# Patient Record
Sex: Female | Born: 1960
Health system: Southern US, Community
[De-identification: ages and names within clinical notes are randomized; demographics above are authoritative.]

## PROBLEM LIST (undated history)

## (undated) DIAGNOSIS — K6389 Other specified diseases of intestine: Secondary | ICD-10-CM

## (undated) DIAGNOSIS — J302 Other seasonal allergic rhinitis: Secondary | ICD-10-CM

## (undated) DIAGNOSIS — M7732 Calcaneal spur, left foot: Secondary | ICD-10-CM

## (undated) HISTORY — DX: Other specified diseases of intestine: K63.89

---

## 2002-08-29 ENCOUNTER — Emergency Department (HOSPITAL_COMMUNITY): Admission: EM | Admit: 2002-08-29 | Discharge: 2002-08-29 | Payer: Self-pay | Admitting: Emergency Medicine

## 2005-08-19 ENCOUNTER — Ambulatory Visit (HOSPITAL_COMMUNITY): Admission: RE | Admit: 2005-08-19 | Discharge: 2005-08-19 | Payer: Self-pay | Admitting: Family Medicine

## 2005-09-18 ENCOUNTER — Ambulatory Visit (HOSPITAL_COMMUNITY): Admission: RE | Admit: 2005-09-18 | Discharge: 2005-09-18 | Payer: Self-pay | Admitting: Family Medicine

## 2006-06-18 ENCOUNTER — Ambulatory Visit (HOSPITAL_COMMUNITY): Admission: RE | Admit: 2006-06-18 | Discharge: 2006-06-18 | Payer: Self-pay | Admitting: Family Medicine

## 2006-11-05 ENCOUNTER — Ambulatory Visit (HOSPITAL_COMMUNITY): Admission: RE | Admit: 2006-11-05 | Discharge: 2006-11-05 | Payer: Self-pay | Admitting: Family Medicine

## 2007-06-24 ENCOUNTER — Ambulatory Visit (HOSPITAL_COMMUNITY): Admission: RE | Admit: 2007-06-24 | Discharge: 2007-06-24 | Payer: Self-pay | Admitting: Family Medicine

## 2008-07-01 ENCOUNTER — Ambulatory Visit (HOSPITAL_COMMUNITY): Admission: RE | Admit: 2008-07-01 | Discharge: 2008-07-01 | Payer: Self-pay | Admitting: Family Medicine

## 2009-09-15 ENCOUNTER — Ambulatory Visit (HOSPITAL_COMMUNITY): Admission: RE | Admit: 2009-09-15 | Discharge: 2009-09-15 | Payer: Self-pay | Admitting: Family Medicine

## 2010-09-17 ENCOUNTER — Ambulatory Visit (HOSPITAL_COMMUNITY)
Admission: RE | Admit: 2010-09-17 | Discharge: 2010-09-17 | Payer: Self-pay | Source: Home / Self Care | Attending: Family Medicine | Admitting: Family Medicine

## 2011-08-20 ENCOUNTER — Other Ambulatory Visit: Payer: Self-pay | Admitting: Family Medicine

## 2011-08-20 DIAGNOSIS — Z139 Encounter for screening, unspecified: Secondary | ICD-10-CM

## 2011-09-20 ENCOUNTER — Ambulatory Visit (HOSPITAL_COMMUNITY)
Admission: RE | Admit: 2011-09-20 | Discharge: 2011-09-20 | Disposition: A | Discharge status: Home / Self Care | Payer: BC Managed Care – PPO | Source: Ambulatory Visit | Attending: Family Medicine | Admitting: Family Medicine

## 2011-09-20 DIAGNOSIS — Z139 Encounter for screening, unspecified: Secondary | ICD-10-CM

## 2011-09-20 DIAGNOSIS — Z1231 Encounter for screening mammogram for malignant neoplasm of breast: Secondary | ICD-10-CM | POA: Insufficient documentation

## 2012-09-09 ENCOUNTER — Other Ambulatory Visit: Payer: Self-pay | Admitting: Family Medicine

## 2012-09-09 DIAGNOSIS — Z09 Encounter for follow-up examination after completed treatment for conditions other than malignant neoplasm: Secondary | ICD-10-CM

## 2012-09-22 ENCOUNTER — Ambulatory Visit (HOSPITAL_COMMUNITY): Payer: BC Managed Care – PPO

## 2012-09-28 ENCOUNTER — Ambulatory Visit (HOSPITAL_COMMUNITY)
Admission: RE | Admit: 2012-09-28 | Discharge: 2012-09-28 | Disposition: A | Discharge status: Home / Self Care | Payer: BC Managed Care – PPO | Source: Ambulatory Visit | Attending: Family Medicine | Admitting: Family Medicine

## 2012-09-28 ENCOUNTER — Ambulatory Visit (HOSPITAL_COMMUNITY): Payer: BC Managed Care – PPO

## 2012-09-28 DIAGNOSIS — Z1231 Encounter for screening mammogram for malignant neoplasm of breast: Secondary | ICD-10-CM | POA: Insufficient documentation

## 2012-09-28 DIAGNOSIS — Z09 Encounter for follow-up examination after completed treatment for conditions other than malignant neoplasm: Secondary | ICD-10-CM

## 2013-07-05 ENCOUNTER — Ambulatory Visit (INDEPENDENT_AMBULATORY_CARE_PROVIDER_SITE_OTHER): Payer: BC Managed Care – PPO | Admitting: Family Medicine

## 2013-07-05 ENCOUNTER — Encounter: Payer: Self-pay | Admitting: Family Medicine

## 2013-07-05 VITALS — Temp 98.0°F | Ht 64.0 in | Wt 163.2 lb

## 2013-07-05 DIAGNOSIS — L259 Unspecified contact dermatitis, unspecified cause: Secondary | ICD-10-CM

## 2013-07-05 DIAGNOSIS — L309 Dermatitis, unspecified: Secondary | ICD-10-CM

## 2013-07-05 MED ORDER — PREDNISONE 20 MG PO TABS: ORAL_TABLET | ORAL | Status: DC

## 2013-07-05 MED ORDER — TRIAMCINOLONE ACETONIDE 0.1 % EX CREA: TOPICAL_CREAM | Freq: Two times a day (BID) | CUTANEOUS | Status: DC

## 2013-07-05 NOTE — Progress Notes (Signed)
  Subjective:    Patient ID: Jennifer Palmer, female    DOB: 08-04-1961, 52 y.o.   MRN: [id number]  Rash This is a new problem. The current episode started in the past 7 days. The problem is unchanged. The affected locations include the left arm and right arm. The rash is characterized by redness and itchiness. She was exposed to nothing. Past treatments include nothing. The treatment provided no relief.   Patient relates she typically wear short sleeves at work she denies any other exposure. Not been working out the aren't new soaps no new soaps no detergents or other problems like that PMH benign family history benign social doesn't smoke  Review of Systems  Skin: Positive for rash.   no fevers chest pain shortness of breath weight loss    Objective:   Physical Exam  Lungs clear heart regular rate used to erythematous patches on both arms has the appearance of hives but does not itch to the same degree that hives do      Assessment & Plan:  Probable allergic dermatitis-prednisone taper along with Kenalog cream when necessary if not improved over the next week let us know we'll set up with dermatology.

## 2013-07-05 NOTE — Patient Instructions (Signed)
Use the cream twice a day  Use the prednisone taper, if this is not improving over the next 7 days please call me and I will set you up with a dermatologist  It is not contagious/ not shingles  This is a allergy issue.

## 2013-10-04 ENCOUNTER — Other Ambulatory Visit: Payer: Self-pay | Admitting: Family Medicine

## 2013-10-04 DIAGNOSIS — Z139 Encounter for screening, unspecified: Secondary | ICD-10-CM

## 2013-10-05 ENCOUNTER — Ambulatory Visit (HOSPITAL_COMMUNITY)
Admission: RE | Admit: 2013-10-05 | Discharge: 2013-10-05 | Disposition: A | Discharge status: Home / Self Care | Payer: BC Managed Care – PPO | Source: Ambulatory Visit | Attending: Family Medicine | Admitting: Family Medicine

## 2013-10-05 DIAGNOSIS — Z139 Encounter for screening, unspecified: Secondary | ICD-10-CM

## 2013-10-05 DIAGNOSIS — Z1231 Encounter for screening mammogram for malignant neoplasm of breast: Secondary | ICD-10-CM | POA: Insufficient documentation

## 2014-10-05 ENCOUNTER — Other Ambulatory Visit: Payer: Self-pay | Admitting: Family Medicine

## 2014-10-05 DIAGNOSIS — Z1231 Encounter for screening mammogram for malignant neoplasm of breast: Secondary | ICD-10-CM

## 2014-10-17 ENCOUNTER — Ambulatory Visit (HOSPITAL_COMMUNITY)
Admission: RE | Admit: 2014-10-17 | Discharge: 2014-10-17 | Disposition: A | Discharge status: Home / Self Care | Payer: BLUE CROSS/BLUE SHIELD | Source: Ambulatory Visit | Attending: Family Medicine | Admitting: Family Medicine

## 2014-10-17 DIAGNOSIS — Z1231 Encounter for screening mammogram for malignant neoplasm of breast: Secondary | ICD-10-CM | POA: Insufficient documentation

## 2015-11-17 ENCOUNTER — Encounter: Payer: Self-pay | Admitting: Family Medicine

## 2015-11-17 ENCOUNTER — Ambulatory Visit (INDEPENDENT_AMBULATORY_CARE_PROVIDER_SITE_OTHER): Payer: BLUE CROSS/BLUE SHIELD | Admitting: Family Medicine

## 2015-11-17 VITALS — BP 130/70 | Temp 99.0°F | Ht 64.0 in | Wt 215.1 lb

## 2015-11-17 DIAGNOSIS — J019 Acute sinusitis, unspecified: Secondary | ICD-10-CM

## 2015-11-17 DIAGNOSIS — B9689 Other specified bacterial agents as the cause of diseases classified elsewhere: Secondary | ICD-10-CM

## 2015-11-17 DIAGNOSIS — J029 Acute pharyngitis, unspecified: Secondary | ICD-10-CM

## 2015-11-17 LAB — POCT RAPID STREP A (OFFICE): RAPID STREP A SCREEN: NEGATIVE

## 2015-11-17 MED ORDER — AMOXICILLIN 500 MG PO TABS: 500.0000 mg | ORAL_TABLET | Freq: Three times a day (TID) | ORAL | Status: DC

## 2015-11-17 NOTE — Progress Notes (Signed)
   Subjective:    Patient ID: Jennifer Palmer, female    DOB: September 10, 1961, 55 y.o.   MRN: [id number]  Sore Throat  This is a new problem. The current episode started 1 to 4 weeks ago. The problem has been unchanged. Neither side of throat is experiencing more pain than the other. There has been no fever. Associated symptoms include coughing. Treatments tried: vicks daytime medicine, nyquil. The treatment provided no relief.   Patient has right foot pain at times.   7 days ago Review of Systems  Respiratory: Positive for cough.    Started with fever  no longer fevr Some sweats No wheeze Tried maeds w/o help    Objective:   Physical Exam   mild sinus tenderness eardrums normal neck supple lungs clear heart regular      Assessment & Plan:   viral syndrome Secondary rhinosinusitis  man had a flulike illness to begin with Antibiotics prescribed warning signs discussed  Patient not toxic no need for x-rays or lab work

## 2015-11-18 LAB — STREP A DNA PROBE: STREP GP A DIRECT, DNA PROBE: NEGATIVE

## 2015-12-01 ENCOUNTER — Other Ambulatory Visit: Payer: Self-pay | Admitting: Family Medicine

## 2015-12-01 DIAGNOSIS — Z1231 Encounter for screening mammogram for malignant neoplasm of breast: Secondary | ICD-10-CM

## 2015-12-12 ENCOUNTER — Encounter: Payer: Self-pay | Admitting: Nurse Practitioner

## 2015-12-12 ENCOUNTER — Ambulatory Visit (INDEPENDENT_AMBULATORY_CARE_PROVIDER_SITE_OTHER): Payer: BLUE CROSS/BLUE SHIELD | Admitting: Nurse Practitioner

## 2015-12-12 VITALS — BP 136/84 | Ht 63.5 in | Wt 215.0 lb

## 2015-12-12 DIAGNOSIS — Z1151 Encounter for screening for human papillomavirus (HPV): Secondary | ICD-10-CM | POA: Diagnosis not present

## 2015-12-12 DIAGNOSIS — Z13220 Encounter for screening for lipoid disorders: Secondary | ICD-10-CM

## 2015-12-12 DIAGNOSIS — R5383 Other fatigue: Secondary | ICD-10-CM | POA: Diagnosis not present

## 2015-12-12 DIAGNOSIS — Z6839 Body mass index (BMI) 39.0-39.9, adult: Secondary | ICD-10-CM | POA: Insufficient documentation

## 2015-12-12 DIAGNOSIS — N920 Excessive and frequent menstruation with regular cycle: Secondary | ICD-10-CM

## 2015-12-12 DIAGNOSIS — Z0000 Encounter for general adult medical examination without abnormal findings: Secondary | ICD-10-CM

## 2015-12-12 DIAGNOSIS — Z124 Encounter for screening for malignant neoplasm of cervix: Secondary | ICD-10-CM | POA: Diagnosis not present

## 2015-12-12 MED ORDER — PHENTERMINE HCL 37.5 MG PO TABS: 37.5000 mg | ORAL_TABLET | Freq: Every day | ORAL | Status: DC

## 2015-12-12 NOTE — Progress Notes (Signed)
Subjective:    Patient ID: Jennifer Palmer, female    DOB: April 26, 1961, 55 y.o.   MRN: [id number]  HPI presents for her wellness exam. Regular menses, slightly heavy flow lasting about 7 days. Same sexual partner. Has mammogram scheduled next week. Regular vision and dental exams. Had lost a significant amount of weight a few years ago through [facility] but has gained it back. Limited activity.     Review of Systems  Constitutional: Negative for fever, activity change, appetite change and fatigue.  HENT: Positive for congestion and rhinorrhea. Negative for dental problem, ear pain, sinus pressure and sore throat.   Respiratory: Negative for cough, chest tightness, shortness of breath and wheezing.   Cardiovascular: Negative for chest pain.  Gastrointestinal: Negative for nausea, vomiting, abdominal pain, diarrhea, constipation, blood in stool and abdominal distention.  Genitourinary: Negative for dysuria, urgency, frequency, vaginal discharge, enuresis, difficulty urinating, genital sores, menstrual problem and pelvic pain.       Objective:   Physical Exam  Constitutional: She is oriented to person, place, and time. She appears well-developed. No distress.  HENT:  Right Ear: External ear normal.  Left Ear: External ear normal.  Mouth/Throat: Oropharynx is clear and moist.  Neck: Normal range of motion. Neck supple. No tracheal deviation present. No thyromegaly present.  Cardiovascular: Normal rate, regular rhythm and normal heart sounds.  Exam reveals no gallop.   No murmur heard. Pulmonary/Chest: Effort normal and breath sounds normal.  Abdominal: Soft. She exhibits no distension. There is no tenderness.  Genitourinary: Vagina normal and uterus normal. No vaginal discharge found.  External GU: no rashes or lesions. Vagina minimal white mucoid discharge. No CMT. Bimanual exam: no tenderness or obvious masses. Rectal exam: no masses; no stool for hemoccult.     Musculoskeletal: She exhibits no edema.  Lymphadenopathy:    She has no cervical adenopathy.  Neurological: She is alert and oriented to person, place, and time.  Skin: Skin is warm and dry. No rash noted.  Psychiatric: She has a normal mood and affect. Her behavior is normal.  Vitals reviewed. Breast exam: large breasts; minimal fine nodularity; no masses; axillae no adenopathy.         Assessment & Plan:   Problem List Items Addressed This Visit      Other   Morbid obesity due to excess calories (HCC)   Relevant Medications   phentermine (ADIPEX-P) 37.5 MG tablet   Other Relevant Orders   TSH    Other Visit Diagnoses    Routine general medical examination at a health care facility    -  Primary    Relevant Orders    Pap IG and HPV (high risk) DNA detection    CBC with Differential/Platelet    Lipid panel    Hepatic function panel    Basic metabolic panel    TSH    VITAMIN D 25 Hydroxy (Vit-D Deficiency, Fractures)    Screening for cervical cancer        Relevant Orders    Pap IG and HPV (high risk) DNA detection    Screening for HPV (human papillomavirus)        Relevant Orders    Pap IG and HPV (high risk) DNA detection    Menorrhagia with regular cycle        Relevant Orders    CBC with Differential/Platelet    Screening for lipid disorders        Relevant Orders    Lipid  panel    Other fatigue        Relevant Orders    CBC with Differential/Platelet    TSH    VITAMIN D 25 Hydroxy (Vit-D Deficiency, Fractures)      Meds ordered this encounter  Medications  . phentermine (ADIPEX-P) 37.5 MG tablet    Sig: Take 1 tablet (37.5 mg total) by mouth daily before breakfast.    Dispense:  30 tablet    Refill:  0    Order Specific Question:  Supervising Provider    Answer:  Maggie Font   Reviewed potential adverse effects. DC med and call if any problems. Recommend regular exercise and daily vitamin D/calcium. Return in about 1 month (around  01/12/2016) for recheck.

## 2015-12-13 LAB — CBC WITH DIFFERENTIAL/PLATELET
Basophils Absolute: 0 10*3/uL (ref 0.0–0.2)
Basos: 0 %
EOS (ABSOLUTE): 0.1 10*3/uL (ref 0.0–0.4)
Eos: 1 %
HEMATOCRIT: 34.5 % (ref 34.0–46.6)
Hemoglobin: 10.7 g/dL — ABNORMAL LOW (ref 11.1–15.9)
Immature Grans (Abs): 0 10*3/uL (ref 0.0–0.1)
Immature Granulocytes: 0 %
Lymphocytes Absolute: 2.1 10*3/uL (ref 0.7–3.1)
Lymphs: 45 %
MCH: 23.2 pg — ABNORMAL LOW (ref 26.6–33.0)
MCHC: 31 g/dL — ABNORMAL LOW (ref 31.5–35.7)
MCV: 75 fL — ABNORMAL LOW (ref 79–97)
Monocytes Absolute: 0.3 10*3/uL (ref 0.1–0.9)
Monocytes: 7 %
Neutrophils Absolute: 2.1 10*3/uL (ref 1.4–7.0)
Neutrophils: 47 %
Platelets: 358 10*3/uL (ref 150–379)
RBC: 4.61 x10E6/uL (ref 3.77–5.28)
RDW: 17.4 % — AB (ref 12.3–15.4)
WBC: 4.7 10*3/uL (ref 3.4–10.8)

## 2015-12-13 LAB — BASIC METABOLIC PANEL
BUN / CREAT RATIO: 23 (ref 9–23)
BUN: 17 mg/dL (ref 6–24)
CO2: 21 mmol/L (ref 18–29)
Calcium: 9.1 mg/dL (ref 8.7–10.2)
Chloride: 103 mmol/L (ref 96–106)
Creatinine, Ser: 0.74 mg/dL (ref 0.57–1.00)
GFR calc Af Amer: 107 mL/min/{1.73_m2} (ref >59)
GFR calc non Af Amer: 93 mL/min/{1.73_m2} (ref >59)
Glucose: 101 mg/dL — ABNORMAL HIGH (ref 65–99)
Potassium: 4.4 mmol/L (ref 3.5–5.2)
Sodium: 138 mmol/L (ref 134–144)

## 2015-12-13 LAB — HEPATIC FUNCTION PANEL
ALT: 10 IU/L (ref 0–32)
AST: 16 IU/L (ref 0–40)
Albumin: 4.3 g/dL (ref 3.5–5.5)
Alkaline Phosphatase: 73 IU/L (ref 39–117)
Bilirubin Total: 0.8 mg/dL (ref 0.0–1.2)
Bilirubin, Direct: 0.18 mg/dL (ref 0.00–0.40)
Total Protein: 7.7 g/dL (ref 6.0–8.5)

## 2015-12-13 LAB — TSH: TSH: 1.85 u[IU]/mL (ref 0.450–4.500)

## 2015-12-13 LAB — LIPID PANEL
Chol/HDL Ratio: 2.9 ratio units (ref 0.0–4.4)
Cholesterol, Total: 178 mg/dL (ref 100–199)
HDL: 61 mg/dL (ref >39)
LDL Calculated: 106 mg/dL — ABNORMAL HIGH (ref 0–99)
Triglycerides: 57 mg/dL (ref 0–149)
VLDL Cholesterol Cal: 11 mg/dL (ref 5–40)

## 2015-12-13 LAB — VITAMIN D 25 HYDROXY (VIT D DEFICIENCY, FRACTURES): Vit D, 25-Hydroxy: 8.1 ng/mL — ABNORMAL LOW (ref 30.0–100.0)

## 2015-12-14 LAB — PAP IG AND HPV HIGH-RISK
HPV, high-risk: NEGATIVE
PAP Smear Comment: 0

## 2015-12-18 ENCOUNTER — Other Ambulatory Visit: Payer: Self-pay | Admitting: Nurse Practitioner

## 2015-12-18 ENCOUNTER — Ambulatory Visit (HOSPITAL_COMMUNITY)
Admission: RE | Admit: 2015-12-18 | Discharge: 2015-12-18 | Disposition: A | Discharge status: Home / Self Care | Payer: BLUE CROSS/BLUE SHIELD | Source: Ambulatory Visit | Attending: Family Medicine | Admitting: Family Medicine

## 2015-12-18 DIAGNOSIS — Z1231 Encounter for screening mammogram for malignant neoplasm of breast: Secondary | ICD-10-CM | POA: Diagnosis not present

## 2015-12-18 MED ORDER — VITAMIN D (ERGOCALCIFEROL) 1.25 MG (50000 UNIT) PO CAPS: 50000.0000 [IU] | ORAL_CAPSULE | ORAL | Status: DC

## 2015-12-28 ENCOUNTER — Telehealth: Payer: Self-pay | Admitting: Family Medicine

## 2015-12-28 NOTE — Telephone Encounter (Signed)
Pt called stating that she was given a prescription for vit d from [facility] last Tuesday. Pt states that she has lost them and wants to know if she can have some more called in.      [facility]

## 2015-12-29 ENCOUNTER — Other Ambulatory Visit: Payer: Self-pay | Admitting: Nurse Practitioner

## 2015-12-29 MED ORDER — VITAMIN D (ERGOCALCIFEROL) 1.25 MG (50000 UNIT) PO CAPS: 50000.0000 [IU] | ORAL_CAPSULE | ORAL | Status: DC

## 2015-12-29 NOTE — Telephone Encounter (Signed)
done

## 2016-01-11 ENCOUNTER — Ambulatory Visit (INDEPENDENT_AMBULATORY_CARE_PROVIDER_SITE_OTHER): Payer: BLUE CROSS/BLUE SHIELD | Admitting: Nurse Practitioner

## 2016-01-11 ENCOUNTER — Encounter: Payer: Self-pay | Admitting: Nurse Practitioner

## 2016-01-11 VITALS — BP 134/70 | Ht 63.5 in | Wt 205.0 lb

## 2016-01-11 DIAGNOSIS — E559 Vitamin D deficiency, unspecified: Secondary | ICD-10-CM | POA: Diagnosis not present

## 2016-01-11 MED ORDER — PHENTERMINE HCL 37.5 MG PO TABS: 37.5000 mg | ORAL_TABLET | Freq: Every day | ORAL | Status: DC

## 2016-01-11 NOTE — Progress Notes (Signed)
Subjective:  Presents for recheck. Currently on vitamin D. Doing well on phentermine. Denies any adverse effects. Now working out regularly on the treadmill. Using her pedometer on her phone. Has cut sodas out of her diet. Drinking more water.  Objective:   BP 134/70 mmHg  Ht 5' 3.5" (1.613 m)  Wt 205 lb (92.987 kg)  BMI 35.74 kg/m2  LMP 12/18/2015 NAD. Alert, oriented. Lungs clear. Heart regular rate rhythm. Has lost 10 pounds since previous visit.  Assessment:  Problem List Items Addressed This Visit      Other   Morbid obesity due to excess calories (HCC)   Relevant Medications   phentermine (ADIPEX-P) 37.5 MG tablet   Vitamin D deficiency - Primary     Plan:  Meds ordered this encounter  Medications  . phentermine (ADIPEX-P) 37.5 MG tablet    Sig: Take 1 tablet (37.5 mg total) by mouth daily before breakfast.    Dispense:  30 tablet    Refill:  2    Order Specific Question:  Supervising Provider    Answer:  Mikey Kirschner [[id number]]   Complete vitamin D as directed. Continue phentermine as directed. Patient wishes to continue weight loss medicine, recheck in 3 months. Call back sooner if any problems. Encouraged patient to continue healthy habits.

## 2016-04-11 ENCOUNTER — Ambulatory Visit (INDEPENDENT_AMBULATORY_CARE_PROVIDER_SITE_OTHER): Payer: BLUE CROSS/BLUE SHIELD | Admitting: Nurse Practitioner

## 2016-04-11 ENCOUNTER — Encounter: Payer: Self-pay | Admitting: Nurse Practitioner

## 2016-04-11 VITALS — BP 106/72 | Ht 63.5 in | Wt 182.2 lb

## 2016-04-11 DIAGNOSIS — N951 Menopausal and female climacteric states: Secondary | ICD-10-CM

## 2016-04-11 MED ORDER — PHENTERMINE HCL 37.5 MG PO TABS: 37.5000 mg | ORAL_TABLET | Freq: Every day | ORAL | Status: DC

## 2016-04-11 NOTE — Progress Notes (Signed)
Subjective:  Presents for recheck. Doing well on Phentermine. Denies any adverse effects. Doing well with her diet. Has started exercising on a regular basis. Using an App on her phone to count her steps every day. Patient was having regular cycles up until May, Had a two-week very heavy cycle but stopped for one week lasted 1 more day and has had some spotting off and on since.                                                                                                                                                                                                             Objective:   BP 106/72 mmHg  Ht 5' 3.5" (1.613 m)  Wt 182 lb 4 oz (82.668 kg)  BMI 31.77 kg/m2 NAD. Alert, oriented. Lungs clear. Heart regular rate rhythm.   Assessment:  Problem List Items Addressed This Visit      Other   Morbid obesity due to excess calories (HCC)   Relevant Medications   phentermine (ADIPEX-P) 37.5 MG tablet    Other Visit Diagnoses    Perimenopause    -  Primary      Plan:  Meds ordered this encounter  Medications  . phentermine (ADIPEX-P) 37.5 MG tablet    Sig: Take 1 tablet (37.5 mg total) by mouth daily before breakfast.    Dispense:  30 tablet    Refill:  1    Order Specific Question:  Supervising Provider    Answer:  Mikey Kirschner [[id number]]   Continue Phentermine for 2 more months. Continue dietary measures and regular exercise. Discussed normal changes with perimenopause. Defers intervention at this time. Call back if heavy or prolonged bleeding or if no cycle for more than 4 months; recommend [place] at that time.

## 2019-07-05 DIAGNOSIS — M7730 Calcaneal spur, unspecified foot: Secondary | ICD-10-CM | POA: Diagnosis not present

## 2019-07-05 DIAGNOSIS — M7660 Achilles tendinitis, unspecified leg: Secondary | ICD-10-CM | POA: Diagnosis not present

## 2019-07-05 DIAGNOSIS — M898X7 Other specified disorders of bone, ankle and foot: Secondary | ICD-10-CM | POA: Diagnosis not present

## 2019-08-18 ENCOUNTER — Other Ambulatory Visit: Payer: Self-pay | Admitting: Podiatry

## 2019-09-10 NOTE — Patient Instructions (Signed)
Jennifer Palmer  09/10/2019     @PREFPERIOPPHARMACY @   Your procedure is scheduled on  09/15/2019 .  Report to [facility] at  0700   A.M.  Call this number if you have problems the morning of surgery:  [phone]   Remember:  Do not eat or drink after midnight.                       Take these medicines the morning of surgery with A SIP OF WATER  Celebrex(if needed).    Do not wear jewelry, make-up or nail polish.  Do not wear lotions, powders, or perfumes. Please wear deodorant and brush your teeth.  Do not shave 48 hours prior to surgery.  Men may shave face and neck.  Do not bring valuables to the hospital.  [facility] is not responsible for any belongings or valuables.  Contacts, dentures or bridgework may not be worn into surgery.  Leave your suitcase in the car.  After surgery it may be brought to your room.  For patients admitted to the hospital, discharge time will be determined by your treatment team.  Patients discharged the day of surgery will not be allowed to drive home.   Name and phone number of your driver:   family Special instructions:  None  Please read over the following fact sheets that you were given. Anesthesia Post-op Instructions and Care and Recovery After Surgery       Ostectomy of the Foot, Care After This sheet gives you information about how to care for yourself after your procedure. Your health care provider may also give you more specific instructions. If you have problems or questions, contact your health care provider. What can I expect after the procedure? After the procedure, it is common to have:  Pain.  Swelling.  Stiffness. Follow these instructions at home: If you have a splint or supportive shoe:  Wear the splint or shoe as told by your health care provider. Remove it only as told by your health care provider.  Loosen the splint or shoe if your toes tingle, become numb, or turn cold and blue.  Keep the  splint or shoe clean.  If the splint or shoe is not waterproof: ? Do not let it get wet. ? Cover it with a watertight covering when you take a bath or a shower. If you have a cast:  Do not stick anything inside the cast to scratch your skin. Doing that increases your risk of infection.  Check the skin around the cast every day. Tell your health care provider about any concerns.  You may put lotion on dry skin around the edges of the cast. Do not put lotion on the skin underneath the cast.  Keep the cast clean.  If the cast is not waterproof: ? Do not let it get wet. ? Cover it with a watertight covering when you take a bath or a shower. Bathing  Do not take baths, swim, or use a hot tub until your health care provider approves. Ask your health care provider if you may take showers. You may only be allowed to take sponge baths for bathing.  If your cast or splint is not waterproof, cover it with a watertight covering when you take a bath or a shower.  Keep the bandage (dressing) dry until your health care provider says it can be removed. Incision care   Follow  instructions from your health care provider about how to take care of your incision. Make sure you: ? Wash your hands with soap and water before you change your bandage (dressing). If soap and water are not available, use hand sanitizer. ? Change your dressing as told by your health care provider. ? Leave stitches (sutures), skin glue, or adhesive strips in place. These skin closures may need to stay in place for 2 weeks or longer. If adhesive strip edges start to loosen and curl up, you may trim the loose edges. Do not remove adhesive strips completely unless your health care provider tells you to do that.  Check your incision area every day for signs of infection. Check for: ? Redness, swelling, or pain. ? Fluid or blood. ? Warmth. ? Pus or a bad smell. Managing pain, stiffness, and swelling   If directed, put ice on  the affected area. ? If you have a removable splint or shoe, remove it as told by your health care provider. ? Put ice in a plastic bag. ? Place a towel between your skin and the bag or between your cast and the bag. ? Leave the ice on for 20 minutes, 2-3 times a day.  Move your foot and toes often to avoid stiffness and to lessen swelling.  Raise (elevate) your foot area above the level of your heart while you are sitting or lying down. Driving  Do not drive or use heavy machinery while taking prescription pain medicine.  Ask your health care provider when it is safe to drive if you have a cast, splint, or supportive shoe on your foot. Activity  Return to your normal activities as told by your health care provider. Ask your health care provider what activities are safe for you.  Do exercises as told by your health care provider. Safety  Do not use your foot to support your body weight until your health care provider says that you can.  Use your crutches or walker as told by your health care provider. General instructions  Do not put pressure on any part of the cast or splint until it is fully hardened. This may take several hours.  Do not use any products that contain nicotine or tobacco, such as cigarettes and e-cigarettes. These can delay bone healing. If you need help quitting, ask your health care provider.  Take over-the-counter and prescription medicines only as told by your health care provider.  Keep all follow-up visits as told by your health care provider. This is important. Contact a health care provider if:  You have chills or a fever.  Your pain is not controlled by your pain medicine.  You have redness, swelling, or pain around your incision.  You have fluid or blood coming from your incision.  Your incision feels warm to the touch.  You have pus or a bad smell coming from your incision. Get help right away if:  You have severe pain.  You have new  pain, warmth, and swelling in your leg.  You have chest pain or difficulty breathing. Summary  After the procedure, it is common to have some pain, swelling, and stiffness.  Follow instructions from your health care provider about how to take care of your incision. Check the incision area every day for signs of infection.  Do not use your foot to support your body weight until your health care provider says that you can.  Move your foot and toes often to avoid stiffness and to  lessen swelling. This information is not intended to replace advice given to you by your health care provider. Make sure you discuss any questions you have with your health care provider. Document Released: 11/12/2016 Document Revised: 11/19/2018 Document Reviewed: 11/12/2016 Elsevier Patient Education  2020 [place] After These instructions provide you with information about caring for yourself after your procedure. Your health care provider may also give you more specific instructions. Your treatment has been planned according to current medical practices, but problems sometimes occur. Call your health care provider if you have any problems or questions after your procedure. What can I expect after the procedure? After your procedure, you may:  Feel sleepy for several hours.  Feel clumsy and have poor balance for several hours.  Feel forgetful about what happened after the procedure.  Have poor judgment for several hours.  Feel nauseous or vomit.  Have a sore throat if you had a breathing tube during the procedure. Follow these instructions at home: For at least 24 hours after the procedure:      Have a responsible adult stay with you. It is important to have someone help care for you until you are awake and alert.  Rest as needed.  Do not: ? Participate in activities in which you could fall or become injured. ? Drive. ? Use heavy machinery. ? Drink alcohol.  ? Take sleeping pills or medicines that cause drowsiness. ? Make important decisions or sign legal documents. ? Take care of children on your own. Eating and drinking  Follow the diet that is recommended by your health care provider.  If you vomit, drink water, juice, or soup when you can drink without vomiting.  Make sure you have little or no nausea before eating solid foods. General instructions  Take over-the-counter and prescription medicines only as told by your health care provider.  If you have sleep apnea, surgery and certain medicines can increase your risk for breathing problems. Follow instructions from your health care provider about wearing your sleep device: ? Anytime you are sleeping, including during daytime naps. ? While taking prescription pain medicines, sleeping medicines, or medicines that make you drowsy.  If you smoke, do not smoke without supervision.  Keep all follow-up visits as told by your health care provider. This is important. Contact a health care provider if:  You keep feeling nauseous or you keep vomiting.  You feel light-headed.  You develop a rash.  You have a fever. Get help right away if:  You have trouble breathing. Summary  For several hours after your procedure, you may feel sleepy and have poor judgment.  Have a responsible adult stay with you for at least 24 hours or until you are awake and alert. This information is not intended to replace advice given to you by your health care provider. Make sure you discuss any questions you have with your health care provider. Document Released: 01/14/2016 Document Revised: 12/22/2017 Document Reviewed: 01/14/2016 Elsevier Patient Education  2020 [employer]. How to Use Chlorhexidine for Bathing Chlorhexidine gluconate (CHG) is a germ-killing (antiseptic) solution that is used to clean the skin. It can get rid of the bacteria that normally live on the skin and can keep them away for about  24 hours. To clean your skin with CHG, you may be given:  A CHG solution to use in the shower or as part of a sponge bath.  A prepackaged cloth that contains CHG. Cleaning your skin with  CHG may help lower the risk for infection:  While you are staying in the intensive care unit of the hospital.  If you have a vascular access, such as a central line, to provide short-term or long-term access to your veins.  If you have a catheter to drain urine from your bladder.  If you are on a ventilator. A ventilator is a machine that helps you breathe by moving air in and out of your lungs.  After surgery. What are the risks? Risks of using CHG include:  A skin reaction.  Hearing loss, if CHG gets in your ears.  Eye injury, if CHG gets in your eyes and is not rinsed out.  The CHG product catching fire. Make sure that you avoid smoking and flames after applying CHG to your skin. Do not use CHG:  If you have a chlorhexidine allergy or have previously reacted to chlorhexidine.  On babies younger than 25 months of age. How to use CHG solution  Use CHG only as told by your health care provider, and follow the instructions on the label.  Use the full amount of CHG as directed. Usually, this is one bottle. During a shower Follow these steps when using CHG solution during a shower (unless your health care provider gives you different instructions): 1. Start the shower. 2. Use your normal soap and shampoo to wash your face and hair. 3. Turn off the shower or move out of the shower stream. 4. Pour the CHG onto a clean washcloth. Do not use any type of brush or rough-edged sponge. 5. Starting at your neck, lather your body down to your toes. Make sure you follow these instructions: ? If you will be having surgery, pay special attention to the part of your body where you will be having surgery. Scrub this area for at least 1 minute. ? Do not use CHG on your head or face. If the solution gets into  your ears or eyes, rinse them well with water. ? Avoid your genital area. ? Avoid any areas of skin that have broken skin, cuts, or scrapes. ? Scrub your back and under your arms. Make sure to wash skin folds. 6. Let the lather sit on your skin for 1-2 minutes or as long as told by your health care provider. 7. Thoroughly rinse your entire body in the shower. Make sure that all body creases and crevices are rinsed well. 8. Dry off with a clean towel. Do not put any substances on your body afterward-such as powder, lotion, or perfume-unless you are told to do so by your health care provider. Only use lotions that are recommended by the manufacturer. 9. Put on clean clothes or pajamas. 10. If it is the night before your surgery, sleep in clean sheets.  During a sponge bath Follow these steps when using CHG solution during a sponge bath (unless your health care provider gives you different instructions): 1. Use your normal soap and shampoo to wash your face and hair. 2. Pour the CHG onto a clean washcloth. 3. Starting at your neck, lather your body down to your toes. Make sure you follow these instructions: ? If you will be having surgery, pay special attention to the part of your body where you will be having surgery. Scrub this area for at least 1 minute. ? Do not use CHG on your head or face. If the solution gets into your ears or eyes, rinse them well with water. ? Avoid your genital area. ?  Avoid any areas of skin that have broken skin, cuts, or scrapes. ? Scrub your back and under your arms. Make sure to wash skin folds. 4. Let the lather sit on your skin for 1-2 minutes or as long as told by your health care provider. 5. Using a different clean, wet washcloth, thoroughly rinse your entire body. Make sure that all body creases and crevices are rinsed well. 6. Dry off with a clean towel. Do not put any substances on your body afterward-such as powder, lotion, or perfume-unless you are told to  do so by your health care provider. Only use lotions that are recommended by the manufacturer. 7. Put on clean clothes or pajamas. 8. If it is the night before your surgery, sleep in clean sheets. How to use CHG prepackaged cloths  Only use CHG cloths as told by your health care provider, and follow the instructions on the label.  Use the CHG cloth on clean, dry skin.  Do not use the CHG cloth on your head or face unless your health care provider tells you to.  When washing with the CHG cloth: ? Avoid your genital area. ? Avoid any areas of skin that have broken skin, cuts, or scrapes. Before surgery Follow these steps when using a CHG cloth to clean before surgery (unless your health care provider gives you different instructions): 1. Using the CHG cloth, vigorously scrub the part of your body where you will be having surgery. Scrub using a back-and-forth motion for 3 minutes. The area on your body should be completely wet with CHG when you are done scrubbing. 2. Do not rinse. Discard the cloth and let the area air-dry. Do not put any substances on the area afterward, such as powder, lotion, or perfume. 3. Put on clean clothes or pajamas. 4. If it is the night before your surgery, sleep in clean sheets.  For general bathing Follow these steps when using CHG cloths for general bathing (unless your health care provider gives you different instructions). 1. Use a separate CHG cloth for each area of your body. Make sure you wash between any folds of skin and between your fingers and toes. Wash your body in the following order, switching to a new cloth after each step: ? The front of your neck, shoulders, and chest. ? Both of your arms, under your arms, and your hands. ? Your stomach and groin area, avoiding the genitals. ? Your right leg and foot. ? Your left leg and foot. ? The back of your neck, your back, and your buttocks. 2. Do not rinse. Discard the cloth and let the area air-dry. Do  not put any substances on your body afterward-such as powder, lotion, or perfume-unless you are told to do so by your health care provider. Only use lotions that are recommended by the manufacturer. 3. Put on clean clothes or pajamas. Contact a health care provider if:  Your skin gets irritated after scrubbing.  You have questions about using your solution or cloth. Get help right away if:  Your eyes become very red or swollen.  Your eyes itch badly.  Your skin itches badly and is red or swollen.  Your hearing changes.  You have trouble seeing.  You have swelling or tingling in your mouth or throat.  You have trouble breathing.  You swallow any chlorhexidine. Summary  Chlorhexidine gluconate (CHG) is a germ-killing (antiseptic) solution that is used to clean the skin. Cleaning your skin with CHG may help to  lower your risk for infection.  You may be given CHG to use for bathing. It may be in a bottle or in a prepackaged cloth to use on your skin. Carefully follow your health care provider's instructions and the instructions on the product label.  Do not use CHG if you have a chlorhexidine allergy.  Contact your health care provider if your skin gets irritated after scrubbing. This information is not intended to replace advice given to you by your health care provider. Make sure you discuss any questions you have with your health care provider. Document Released: 06/17/2012 Document Revised: 12/10/2018 Document Reviewed: 08/21/2017 Elsevier Patient Education  2020 [employer].

## 2019-09-13 ENCOUNTER — Encounter (HOSPITAL_COMMUNITY)
Admission: RE | Admit: 2019-09-13 | Discharge: 2019-09-13 | Disposition: A | Discharge status: Home / Self Care | Payer: BC Managed Care – PPO | Source: Ambulatory Visit | Attending: Podiatry | Admitting: Podiatry

## 2019-09-13 ENCOUNTER — Other Ambulatory Visit: Payer: Self-pay

## 2019-09-13 ENCOUNTER — Encounter (HOSPITAL_COMMUNITY): Payer: Self-pay

## 2019-09-13 ENCOUNTER — Other Ambulatory Visit (HOSPITAL_COMMUNITY)
Admission: RE | Admit: 2019-09-13 | Discharge: 2019-09-13 | Disposition: A | Discharge status: Home / Self Care | Payer: BC Managed Care – PPO | Source: Ambulatory Visit | Attending: Podiatry | Admitting: Podiatry

## 2019-09-13 DIAGNOSIS — M7660 Achilles tendinitis, unspecified leg: Secondary | ICD-10-CM | POA: Diagnosis not present

## 2019-09-13 DIAGNOSIS — Z01812 Encounter for preprocedural laboratory examination: Secondary | ICD-10-CM | POA: Insufficient documentation

## 2019-09-13 DIAGNOSIS — M7730 Calcaneal spur, unspecified foot: Secondary | ICD-10-CM | POA: Diagnosis not present

## 2019-09-13 DIAGNOSIS — S99922A Unspecified injury of left foot, initial encounter: Secondary | ICD-10-CM | POA: Diagnosis not present

## 2019-09-13 DIAGNOSIS — M79672 Pain in left foot: Secondary | ICD-10-CM | POA: Diagnosis not present

## 2019-09-13 HISTORY — DX: Calcaneal spur, left foot: M77.32

## 2019-09-13 LAB — HCG, SERUM, QUALITATIVE: Preg, Serum: NEGATIVE

## 2019-09-13 LAB — SARS CORONAVIRUS 2 (TAT 6-24 HRS): SARS Coronavirus 2: NEGATIVE

## 2019-09-15 ENCOUNTER — Other Ambulatory Visit: Payer: Self-pay

## 2019-09-15 ENCOUNTER — Ambulatory Visit (HOSPITAL_COMMUNITY): Payer: BC Managed Care – PPO

## 2019-09-15 ENCOUNTER — Encounter (HOSPITAL_COMMUNITY)
Admission: RE | Disposition: A | Discharge status: Home / Self Care | Payer: Self-pay | Source: Home / Self Care | Attending: Podiatry

## 2019-09-15 ENCOUNTER — Ambulatory Visit (HOSPITAL_COMMUNITY): Payer: BC Managed Care – PPO | Admitting: Anesthesiology

## 2019-09-15 ENCOUNTER — Ambulatory Visit (HOSPITAL_COMMUNITY)
Admission: RE | Admit: 2019-09-15 | Discharge: 2019-09-15 | Disposition: A | Discharge status: Home / Self Care | Payer: BC Managed Care – PPO | Attending: Podiatry | Admitting: Podiatry

## 2019-09-15 ENCOUNTER — Encounter (HOSPITAL_COMMUNITY): Payer: Self-pay

## 2019-09-15 DIAGNOSIS — Z9889 Other specified postprocedural states: Secondary | ICD-10-CM

## 2019-09-15 DIAGNOSIS — M898X7 Other specified disorders of bone, ankle and foot: Secondary | ICD-10-CM | POA: Diagnosis not present

## 2019-09-15 DIAGNOSIS — M7730 Calcaneal spur, unspecified foot: Secondary | ICD-10-CM | POA: Diagnosis not present

## 2019-09-15 DIAGNOSIS — M7660 Achilles tendinitis, unspecified leg: Secondary | ICD-10-CM | POA: Diagnosis not present

## 2019-09-15 DIAGNOSIS — M6788 Other specified disorders of synovium and tendon, other site: Secondary | ICD-10-CM

## 2019-09-15 DIAGNOSIS — M7732 Calcaneal spur, left foot: Secondary | ICD-10-CM | POA: Insufficient documentation

## 2019-09-15 DIAGNOSIS — M7662 Achilles tendinitis, left leg: Secondary | ICD-10-CM | POA: Diagnosis not present

## 2019-09-15 DIAGNOSIS — M79672 Pain in left foot: Secondary | ICD-10-CM | POA: Diagnosis not present

## 2019-09-15 DIAGNOSIS — Z01818 Encounter for other preprocedural examination: Secondary | ICD-10-CM | POA: Diagnosis not present

## 2019-09-15 HISTORY — PX: HEEL SPUR RESECTION: SHX6410

## 2019-09-15 SURGERY — EXCISION, BONE SPUR, CALCANEUS
Anesthesia: General | Site: Foot | Laterality: Left

## 2019-09-15 MED ORDER — LIDOCAINE 2% (20 MG/ML) 5 ML SYRINGE
INTRAMUSCULAR | Status: AC
  Filled 2019-09-15: qty 5

## 2019-09-15 MED ORDER — 0.9 % SODIUM CHLORIDE (POUR BTL) OPTIME
TOPICAL | Status: DC | PRN
  Administered 2019-09-15: 1000 mL

## 2019-09-15 MED ORDER — PROMETHAZINE HCL 25 MG/ML IJ SOLN: 6.2500 mg | INTRAMUSCULAR | Status: DC | PRN

## 2019-09-15 MED ORDER — MIDAZOLAM HCL 2 MG/2ML IJ SOLN
INTRAMUSCULAR | Status: AC
  Filled 2019-09-15: qty 2

## 2019-09-15 MED ORDER — ONDANSETRON HCL 4 MG/2ML IJ SOLN
INTRAMUSCULAR | Status: AC
  Filled 2019-09-15: qty 2

## 2019-09-15 MED ORDER — CEFAZOLIN SODIUM-DEXTROSE 2-4 GM/100ML-% IV SOLN
2.0000 g | Freq: Once | INTRAVENOUS | Status: AC
  Administered 2019-09-15: 2 g via INTRAVENOUS
  Filled 2019-09-15: qty 100

## 2019-09-15 MED ORDER — HYDROMORPHONE HCL 1 MG/ML IJ SOLN: 0.2500 mg | INTRAMUSCULAR | Status: DC | PRN

## 2019-09-15 MED ORDER — PROPOFOL 10 MG/ML IV BOLUS
INTRAVENOUS | Status: DC | PRN
  Administered 2019-09-15: 200 mg via INTRAVENOUS

## 2019-09-15 MED ORDER — FENTANYL CITRATE (PF) 250 MCG/5ML IJ SOLN
INTRAMUSCULAR | Status: AC
  Filled 2019-09-15: qty 5

## 2019-09-15 MED ORDER — LIDOCAINE HCL (PF) 1 % IJ SOLN
INTRAMUSCULAR | Status: AC
  Filled 2019-09-15: qty 30

## 2019-09-15 MED ORDER — LIDOCAINE HCL (CARDIAC) PF 100 MG/5ML IV SOSY
PREFILLED_SYRINGE | INTRAVENOUS | Status: DC | PRN
  Administered 2019-09-15: 80 mg via INTRAVENOUS

## 2019-09-15 MED ORDER — ROCURONIUM BROMIDE 10 MG/ML (PF) SYRINGE
PREFILLED_SYRINGE | INTRAVENOUS | Status: AC
  Filled 2019-09-15: qty 10

## 2019-09-15 MED ORDER — PROPOFOL 10 MG/ML IV BOLUS
INTRAVENOUS | Status: AC
  Filled 2019-09-15: qty 40

## 2019-09-15 MED ORDER — ROCURONIUM BROMIDE 100 MG/10ML IV SOLN
INTRAVENOUS | Status: DC | PRN
  Administered 2019-09-15: 60 mg via INTRAVENOUS

## 2019-09-15 MED ORDER — FENTANYL CITRATE (PF) 100 MCG/2ML IJ SOLN
INTRAMUSCULAR | Status: AC
  Filled 2019-09-15: qty 2

## 2019-09-15 MED ORDER — DEXAMETHASONE SODIUM PHOSPHATE 10 MG/ML IJ SOLN
INTRAMUSCULAR | Status: DC | PRN
  Administered 2019-09-15: 10 mg via INTRAVENOUS

## 2019-09-15 MED ORDER — FENTANYL CITRATE (PF) 100 MCG/2ML IJ SOLN
INTRAMUSCULAR | Status: DC | PRN
  Administered 2019-09-15 (×3): 100 ug via INTRAVENOUS

## 2019-09-15 MED ORDER — LACTATED RINGERS IV SOLN
INTRAVENOUS | Status: DC | PRN
  Administered 2019-09-15 (×2): via INTRAVENOUS

## 2019-09-15 MED ORDER — BUPIVACAINE HCL (PF) 0.5 % IJ SOLN
INTRAMUSCULAR | Status: AC
  Filled 2019-09-15: qty 30

## 2019-09-15 MED ORDER — MEPERIDINE HCL 50 MG/ML IJ SOLN: 6.2500 mg | INTRAMUSCULAR | Status: DC | PRN

## 2019-09-15 MED ORDER — MIDAZOLAM HCL 2 MG/2ML IJ SOLN
2.0000 mg | Freq: Once | INTRAMUSCULAR | Status: AC
  Administered 2019-09-15: 08:00:00 2 mg via INTRAVENOUS

## 2019-09-15 MED ORDER — ONDANSETRON HCL 4 MG/2ML IJ SOLN
INTRAMUSCULAR | Status: DC | PRN
  Administered 2019-09-15: 4 mg via INTRAVENOUS

## 2019-09-15 MED ORDER — LACTATED RINGERS IV SOLN
Freq: Once | INTRAVENOUS | Status: AC
  Administered 2019-09-15: 08:00:00 via INTRAVENOUS

## 2019-09-15 MED ORDER — BUPIVACAINE HCL (PF) 0.5 % IJ SOLN
INTRAMUSCULAR | Status: DC | PRN
  Administered 2019-09-15: 20 mL

## 2019-09-15 MED ORDER — DEXAMETHASONE SODIUM PHOSPHATE 10 MG/ML IJ SOLN
INTRAMUSCULAR | Status: AC
  Filled 2019-09-15: qty 1

## 2019-09-15 MED ORDER — SUGAMMADEX SODIUM 500 MG/5ML IV SOLN
INTRAVENOUS | Status: DC | PRN
  Administered 2019-09-15: 400 mg via INTRAVENOUS

## 2019-09-15 SURGICAL SUPPLY — 66 items
APL PRP STRL LF DISP 70% ISPRP (MISCELLANEOUS) ×1
APL SKNCLS STERI-STRIP NONHPOA (GAUZE/BANDAGES/DRESSINGS) ×1
BANDAGE ELASTIC 4 VELCRO NS (GAUZE/BANDAGES/DRESSINGS) ×4 IMPLANT
BANDAGE ELASTIC 6 VELCRO NS (GAUZE/BANDAGES/DRESSINGS) ×2 IMPLANT
BANDAGE ESMARK 4X12 BL STRL LF (DISPOSABLE) ×1 IMPLANT
BENZOIN TINCTURE PRP APPL 2/3 (GAUZE/BANDAGES/DRESSINGS) ×2 IMPLANT
BLADE AVERAGE 25MMX9MM (BLADE) ×1
BLADE AVERAGE 25X9 (BLADE) ×2 IMPLANT
BLADE OSC/SAG 18.5X9 THN (BLADE) ×3 IMPLANT
BLADE SURG 15 STRL LF DISP TIS (BLADE) ×1 IMPLANT
BLADE SURG 15 STRL SS (BLADE) ×6
BNDG CMPR 12X4 ELC STRL LF (DISPOSABLE) ×1
BNDG CMPR STD VLCR NS LF 5.8X4 (GAUZE/BANDAGES/DRESSINGS) ×1
BNDG CONFORM 2 STRL LF (GAUZE/BANDAGES/DRESSINGS) ×2 IMPLANT
BNDG ELASTIC 4X5.8 VLCR NS LF (GAUZE/BANDAGES/DRESSINGS) ×3 IMPLANT
BNDG ESMARK 4X12 BLUE STRL LF (DISPOSABLE) ×3
BNDG GAUZE ELAST 4 BULKY (GAUZE/BANDAGES/DRESSINGS) ×3 IMPLANT
BOOT STEPPER DURA LG (SOFTGOODS) ×2 IMPLANT
BOOT STEPPER DURA MED (SOFTGOODS) IMPLANT
CHLORAPREP W/TINT 26 (MISCELLANEOUS) ×3 IMPLANT
CLOSURE WOUND 1/2 X4 (GAUZE/BANDAGES/DRESSINGS) ×1
CLOTH BEACON ORANGE TIMEOUT ST (SAFETY) ×3 IMPLANT
COVER LIGHT HANDLE STERIS (MISCELLANEOUS) ×6 IMPLANT
COVER WAND RF STERILE (DRAPES) ×3 IMPLANT
CUFF TOURN SGL QUICK 34 (TOURNIQUET CUFF) ×3
CUFF TRNQT CYL 34X4.125X (TOURNIQUET CUFF) ×1 IMPLANT
DRAPE OEC MINIVIEW 54X84 (DRAPES) ×3 IMPLANT
DRSG ADAPTIC 3X8 NADH LF (GAUZE/BANDAGES/DRESSINGS) ×3 IMPLANT
ELECT REM PT RETURN 9FT ADLT (ELECTROSURGICAL) ×3
ELECTRODE REM PT RTRN 9FT ADLT (ELECTROSURGICAL) ×1 IMPLANT
GAUZE SPONGE 4X4 12PLY STRL (GAUZE/BANDAGES/DRESSINGS) ×2 IMPLANT
GLOVE BIO SURGEON STRL SZ7 (GLOVE) ×4 IMPLANT
GLOVE BIO SURGEON STRL SZ7.5 (GLOVE) ×3 IMPLANT
GLOVE BIOGEL PI IND STRL 7.0 (GLOVE) ×3 IMPLANT
GLOVE BIOGEL PI IND STRL 7.5 (GLOVE) ×1 IMPLANT
GLOVE BIOGEL PI INDICATOR 7.0 (GLOVE) ×6
GLOVE BIOGEL PI INDICATOR 7.5 (GLOVE) ×2
GLOVE ECLIPSE 7.0 STRL STRAW (GLOVE) ×6 IMPLANT
GOWN STRL REUS W/ TWL LRG LVL3 (GOWN DISPOSABLE) ×1 IMPLANT
GOWN STRL REUS W/TWL LRG LVL3 (GOWN DISPOSABLE) ×9 IMPLANT
IMPL SPEEDBRIDGE KIT (Orthopedic Implant) IMPLANT
IMPLANT SPEEDBRIDGE KIT (Orthopedic Implant) ×3 IMPLANT
KIT TURNOVER KIT A (KITS) ×3 IMPLANT
MANIFOLD NEPTUNE II (INSTRUMENTS) ×3 IMPLANT
NDL HYPO 18GX1.5 BLUNT FILL (NEEDLE) ×1 IMPLANT
NDL HYPO 25X1 1.5 SAFETY (NEEDLE) ×4 IMPLANT
NDL MA TROC 1/2 (NEEDLE) IMPLANT
NEEDLE HYPO 18GX1.5 BLUNT FILL (NEEDLE) ×3 IMPLANT
NEEDLE HYPO 25X1 1.5 SAFETY (NEEDLE) ×12 IMPLANT
NEEDLE MA TROC 1/2 (NEEDLE) ×3 IMPLANT
NS IRRIG 1000ML POUR BTL (IV SOLUTION) ×3 IMPLANT
PACK BASIC LIMB (CUSTOM PROCEDURE TRAY) ×3 IMPLANT
PAD ABD 5X9 TENDERSORB (GAUZE/BANDAGES/DRESSINGS) ×2 IMPLANT
PAD ARMBOARD 7.5X6 YLW CONV (MISCELLANEOUS) ×3 IMPLANT
RASP SM TEAR CROSS CUT (RASP) ×3 IMPLANT
SET BASIN LINEN APH (SET/KITS/TRAYS/PACK) ×3 IMPLANT
SPLINT FIBERGLASS 4X30 (CAST SUPPLIES) ×2 IMPLANT
STRIP CLOSURE SKIN 1/2X4 (GAUZE/BANDAGES/DRESSINGS) ×3 IMPLANT
SUT ETHILON 3 0 FSL (SUTURE) ×2 IMPLANT
SUT VIC AB 3-0 SH 27 (SUTURE) ×3
SUT VIC AB 3-0 SH 27X BRD (SUTURE) IMPLANT
SUT VIC AB 4-0 PS2 27 (SUTURE) ×2 IMPLANT
SUT VIC AB 4-0 SH 27 (SUTURE) ×3
SUT VIC AB 4-0 SH 27XBRD (SUTURE) IMPLANT
SYR CONTROL 10ML LL (SYRINGE) ×6 IMPLANT
TOWEL OR 17X26 4PK STRL BLUE (TOWEL DISPOSABLE) ×3 IMPLANT

## 2019-09-15 NOTE — Anesthesia Preprocedure Evaluation (Addendum)
Anesthesia Evaluation  Patient identified by MRN, date of birth, ID band Patient awake    Reviewed: Allergy & Precautions, NPO status , Patient's Chart, lab work & pertinent test results  Airway Mallampati: II  TM Distance: >3 FB Neck ROM: Full    Dental  (+) Caps   Pulmonary neg pulmonary ROS,    Pulmonary exam normal breath sounds clear to auscultation       Cardiovascular Exercise Tolerance: Good Normal cardiovascular exam Rhythm:Regular Rate:Normal     Neuro/Psych negative neurological ROS     GI/Hepatic negative GI ROS, Neg liver ROS,   Endo/Other    Renal/GU negative Renal ROS     Musculoskeletal   Abdominal   Peds  Hematology negative hematology ROS (+)   Anesthesia Other Findings   Reproductive/Obstetrics                            Anesthesia Physical Anesthesia Plan  ASA: II  Anesthesia Plan: General   Post-op Pain Management:    Induction:   PONV Risk Score and Plan: 3 and Ondansetron, Dexamethasone and Midazolam  Airway Management Planned: Oral ETT  Additional Equipment:   Intra-op Plan:   Post-operative Plan: Extubation in OR  Informed Consent: I have reviewed the patients History and Physical, chart, labs and discussed the procedure including the risks, benefits and alternatives for the proposed anesthesia with the patient or authorized representative who has indicated his/her understanding and acceptance.     Dental advisory given  Plan Discussed with: CRNA  Anesthesia Plan Comments:        Anesthesia Quick Evaluation

## 2019-09-15 NOTE — Op Note (Signed)
09/15/2019  10:35 AM  PATIENT:  Jennifer Palmer  58 y.o. female  PRE-OPERATIVE DIAGNOSIS:  retrocalcaneal heel spur left foot, achilles tendonitis left  POST-OPERATIVE DIAGNOSIS:  retrocalcaneal heel spur left foot, achilles tendonitis left  PROCEDURE:  Procedure(s): HEEL SPUR RESECTION WITH REPAIR & REATTACHMENT OF ACHILLES TENDON (Left) Application of the posterior splint.   SURGEON:  Surgeon(s) and Role:    * Manami Tutor, Dance movement psychotherapist, DPM - Primary  ASSISTANTS: None  ANESTHESIA:   general  EBL:  10 mL   LOCAL MEDICATIONS USED:  MARCAINE    and Amount: 20 ml post op  SPECIMEN:  Source of Specimen:  left foot bone and calcified tendon.   TOURNIQUET:   Total Tourniquet Time Documented: Thigh (Left) - 83 minutes Total: Thigh (Left) - 83 minutes  Material used: 4.75 Ar[employer] speedbridge anchor, 2-0 vicryl, 3-0 vicryl, 4-0 nylon, posterior splint.   Patient was brought into the operating room. After general anesthesia, patient was placed prone onto the operating table. Thigh tourniquet was applied to the surgical extremity. All the bony prominence were padded.  The foot, ankle and leg were the prepped, scrubbed and draped in aseptic manner. Using an esmarch band the tourniquet on the surgical site was inflatted at 318mmHG.   Attention was directed toward left posterior heel. There was prominent bump visualized at the posterior superior aspect of the left calcaneus. A midline incision approach was planned. A7cm midline incision to achilles tendon was made down to skin and subcutaneous tissue with full flap at the inferior aspect of the incision. Care was made to preserve the paratenon. Next the paratenon was dissected off carefully from achilles tendon. There was bony exostosis felt at most inferior part of the incision. The achilles tendon was now visualized and thickened at the most inferior part. Achilles tendon was reflected off to visualize the posterior spur. Care was made to keep the  most medial and lateral insertion intact.  Alltendinopathic tissue was removed from the tendon and around the tendon. At this the retrocalcaneal bursa was visualized and appeared as fatty inflamed tissue. This was removed.Achilles tendon distally was nowreflected medially and laterally and expose the whole calcaneal tuberosity with aprominent posterior spur.At this timethe Haglund's prominenceand spur were resectedusing a microsagittal saw. Power rasp was used to rasp down any bony prominence. Fluoroscopy was used to see if any bony prominent left. At this time surgical site was flushed with normal saline.  Bone was ready to insert the speedbride and reattach the achilles tendon. Two (2) 4.75 mm Bio[employer]posite SwiveLock anchorswere drilled about 1cm apart from each other using drill guide from the set. The2 holeswereabout 1 cm proximal to the distal insertion of the Achilles tendon and central to each half of the tendon.Then4.75 mm SwiveLock tapwas usedto prepare the holes for the 4.75 mm SwiveLock anchors.Insertedtwo (2) 4.75 mm Bio[employer]posite SwiveLock anchors loaded with FiberTape suture, 1 blue and 1 white/black, intothe proximal holes.Anchors were noted to be flushed with bone. The anchor driver was then removed and needle attached to the 30mm fiber tape were sutured through the achilles tendon on each side. At this time thedistal holes were drilled with3.5 mm drill in the same manner as the proximal holes.Holes were tapped using 4.71mm SwiveLock anchors.After cutting the swedged portion on each proximal anchor, retrieved1 FiberTape suture tail from each proximal anchor (1 blue and 1 white/black) and preloadedthem through the distal SwiveLock anchor eyelet.Tension of the FiberTape suturewas adjustedand insertedthe 4.75 mm SwiveLock anchor into the prepared distal bone  socket until the anchor body contacts bone. Anchors were flushed to the bone. The tails on the distal row  were cut and anchor was flushed to the bone. Fluoroscopy was used to confirm the position of the anchors. Tendon was firmly attached and strength was tested on the table. Remaining achilles tendon was suture using 2-0 fiber tape from the speedbridge set. The paratenon was closed using 3-0 Vicryl in running fashion. Sub cuticular stiches were done to close the skin using 4-0 Vicryl.Skin was closed using 3-0 Nylon. 20cc of marcaine plaine was injected around the incision site. Dry sterile dressing was applied. Fibreglass posterior splint was applied to the surgical extremity in plantar flexion position. Tourniquet was deflated. Capillary refill was immediate to all the toes. Patient was extubated and transferred to PACU. Vitals are stable.   Patient will follow up with me in my office. She has knee scooter and pain medication.

## 2019-09-15 NOTE — Brief Op Note (Signed)
09/15/2019  10:35 AM  PATIENT:  Princess Bruins  58 y.o. female  PRE-OPERATIVE DIAGNOSIS:  retrocalcaneal heel spur left foot, achilles tendonitis left  POST-OPERATIVE DIAGNOSIS:  retrocalcaneal heel spur left foot, achilles tendonitis left  PROCEDURE:  Procedure(s): HEEL SPUR RESECTION WITH REPAIR & REATTACHMENT OF ACHILLES TENDON (Left)  SURGEON:  Surgeon(s) and Role:    * Devanta Daniel, DPM - Primary   ASSISTANTS: None  ANESTHESIA:   general  EBL:  10 mL   BLOOD ADMINISTERED:none  DRAINS: none   LOCAL MEDICATIONS USED:  MARCAINE    and Amount: 20 ml post op  SPECIMEN:  Source of Specimen:  left foot bone and calcified tendon.   DISPOSITION OF SPECIMEN:  PATHOLOGY  COUNTS:  YES  TOURNIQUET:   Total Tourniquet Time Documented: Thigh (Left) - 83 minutes Total: Thigh (Left) - 83 minutes   DICTATION: .Viviann Spare Dictation  PLAN OF CARE: Discharge to home after PACU  PATIENT DISPOSITION:  PACU - hemodynamically stable.   Delay start of Pharmacological VTE agent (>24hrs) due to surgical blood loss or risk of bleeding: not applicable

## 2019-09-15 NOTE — Discharge Instructions (Signed)

## 2019-09-15 NOTE — Anesthesia Postprocedure Evaluation (Signed)
Anesthesia Post Note  Patient: Jennifer Palmer  Procedure(s) Performed: HEEL SPUR RESECTION WITH REPAIR & REATTACHMENT OF ACHILLES TENDON (Left Foot)  Anesthesia Type: General Level of consciousness: awake, oriented, awake and alert and patient cooperative Pain management: pain level controlled Vital Signs Assessment: post-procedure vital signs reviewed and stable Respiratory status: spontaneous breathing, respiratory function stable, nonlabored ventilation and patient connected to face mask oxygen Cardiovascular status: stable Postop Assessment: no apparent nausea or vomiting Anesthetic complications: no     Last Vitals:  Vitals:   09/15/19 0743 09/15/19 1037  BP:  105/65  Pulse:  74  Resp:  16  Temp: 36.8 C 36.7 C  SpO2: 91% 98%    Last Pain:  Vitals:   09/15/19 0743  TempSrc: Oral  PainSc:                  [name]

## 2019-09-15 NOTE — Transfer of Care (Signed)
Immediate Anesthesia Transfer of Care Note  Patient: Jennifer Palmer  Procedure(s) Performed: HEEL SPUR RESECTION WITH REPAIR & REATTACHMENT OF ACHILLES TENDON (Left Foot)  Patient Location: PACU  Anesthesia Type:General  Level of Consciousness: awake, alert  and oriented  Airway & Oxygen Therapy: Patient Spontanous Breathing and Patient connected to face mask oxygen  Post-op Assessment: Report given to RN, Post -op Vital signs reviewed and stable and Patient moving all extremities X 4  Post vital signs: Reviewed and stable  Last Vitals:  Vitals Value Taken Time  BP    Temp    Pulse 78 09/15/19 1035  Resp    SpO2 96 % 09/15/19 1035  Vitals shown include unvalidated device data.  Last Pain:  Vitals:   09/15/19 0743  TempSrc: Oral  PainSc:          Complications: No apparent anesthesia complications

## 2019-09-15 NOTE — Anesthesia Procedure Notes (Signed)
Procedure Name: Intubation Date/Time: 09/15/2019 8:30 AM Performed by: Jonna Munro, CRNA Pre-anesthesia Checklist: Patient identified, Emergency Drugs available, Suction available, Patient being monitored and Timeout performed Patient Re-evaluated:Patient Re-evaluated prior to induction Oxygen Delivery Method: Circle system utilized Preoxygenation: Pre-oxygenation with 100% oxygen Induction Type: IV induction Ventilation: Mask ventilation without difficulty Laryngoscope Size: Mac and 3 Grade View: Grade I Tube type: Oral Tube size: 7.0 mm Number of attempts: 1 Airway Equipment and Method: Stylet Placement Confirmation: ETT inserted through vocal cords under direct vision,  positive ETCO2 and breath sounds checked- equal and bilateral Secured at: 21 cm Tube secured with: Tape Dental Injury: Teeth and Oropharynx as per pre-operative assessment

## 2019-09-15 NOTE — H&P (Signed)
.   HISTORY AND PHYSICAL INTERVAL NOTE:  09/15/2019  8:12 AM  Jennifer Palmer  has presented today for surgery, with the diagnosis of retrocalcaneal heel spur left foot, achilles tendonitis left.  The various methods of treatment have been discussed with the patient.  No guarantees were given.  After consideration of risks, benefits and other options for treatment, the patient has consented to surgery.  I have reviewed the patients' chart and labs.    Patient Vitals for the past 24 hrs:  BP Temp Temp src Pulse Resp SpO2 Height Weight  09/15/19 0743 - 98.2 F (36.8 C) Oral - - 91 % 5\' 4"  (1.626 m) 101.2 kg  09/15/19 0739 132/77 - - 74 20 - - -    A history and physical examination was performed in my office.  The patient was reexamined.  There have been no changes to this history and physical examination.  Jennifer Palmer, DPM

## 2019-09-17 LAB — SURGICAL PATHOLOGY

## 2020-06-29 ENCOUNTER — Telehealth: Payer: Self-pay | Admitting: Family Medicine

## 2020-06-29 NOTE — Telephone Encounter (Signed)
Pt made appt Phy with Hoyle Sauer on 10/29 needs blood work?

## 2020-06-30 ENCOUNTER — Other Ambulatory Visit: Payer: Self-pay | Admitting: *Deleted

## 2020-06-30 DIAGNOSIS — Z0000 Encounter for general adult medical examination without abnormal findings: Secondary | ICD-10-CM

## 2020-06-30 DIAGNOSIS — Z1329 Encounter for screening for other suspected endocrine disorder: Secondary | ICD-10-CM

## 2020-06-30 DIAGNOSIS — E559 Vitamin D deficiency, unspecified: Secondary | ICD-10-CM

## 2020-06-30 DIAGNOSIS — Z13220 Encounter for screening for lipoid disorders: Secondary | ICD-10-CM

## 2020-06-30 DIAGNOSIS — Z79899 Other long term (current) drug therapy: Secondary | ICD-10-CM

## 2020-06-30 NOTE — Telephone Encounter (Signed)
Please reorder previous labs listed. Thanks.

## 2020-06-30 NOTE — Telephone Encounter (Signed)
Lm to notifiy pt of labs.

## 2020-08-04 ENCOUNTER — Encounter: Payer: Self-pay | Admitting: Nurse Practitioner

## 2020-08-04 ENCOUNTER — Ambulatory Visit (INDEPENDENT_AMBULATORY_CARE_PROVIDER_SITE_OTHER): Payer: BC Managed Care – PPO | Admitting: Nurse Practitioner

## 2020-08-04 ENCOUNTER — Other Ambulatory Visit: Payer: Self-pay

## 2020-08-04 VITALS — BP 152/88 | HR 110 | Temp 97.6°F | Wt 220.6 lb

## 2020-08-04 DIAGNOSIS — Z79899 Other long term (current) drug therapy: Secondary | ICD-10-CM | POA: Diagnosis not present

## 2020-08-04 DIAGNOSIS — Z1151 Encounter for screening for human papillomavirus (HPV): Secondary | ICD-10-CM | POA: Diagnosis not present

## 2020-08-04 DIAGNOSIS — Z13220 Encounter for screening for lipoid disorders: Secondary | ICD-10-CM | POA: Diagnosis not present

## 2020-08-04 DIAGNOSIS — Z23 Encounter for immunization: Secondary | ICD-10-CM

## 2020-08-04 DIAGNOSIS — Z01419 Encounter for gynecological examination (general) (routine) without abnormal findings: Secondary | ICD-10-CM | POA: Diagnosis not present

## 2020-08-04 DIAGNOSIS — Z1231 Encounter for screening mammogram for malignant neoplasm of breast: Secondary | ICD-10-CM

## 2020-08-04 DIAGNOSIS — Z124 Encounter for screening for malignant neoplasm of cervix: Secondary | ICD-10-CM

## 2020-08-04 DIAGNOSIS — Z0000 Encounter for general adult medical examination without abnormal findings: Secondary | ICD-10-CM

## 2020-08-04 DIAGNOSIS — E559 Vitamin D deficiency, unspecified: Secondary | ICD-10-CM | POA: Diagnosis not present

## 2020-08-04 DIAGNOSIS — Z1329 Encounter for screening for other suspected endocrine disorder: Secondary | ICD-10-CM | POA: Diagnosis not present

## 2020-08-04 DIAGNOSIS — R030 Elevated blood-pressure reading, without diagnosis of hypertension: Secondary | ICD-10-CM

## 2020-08-04 DIAGNOSIS — F4321 Adjustment disorder with depressed mood: Secondary | ICD-10-CM

## 2020-08-04 DIAGNOSIS — Z1211 Encounter for screening for malignant neoplasm of colon: Secondary | ICD-10-CM

## 2020-08-04 NOTE — Patient Instructions (Addendum)
Lab Work Today after visit Proper footwear for support at all times Avoid prolonged standing in one position Adequate rest sleep regimen Diet modifications: low sugar, fried foods, and carbohydrates, increase protein Mammogram scheduled today Claritin OTC Daily for sinus pressure Flonase nasal spray for sinus pressure Call or message for further needs

## 2020-08-04 NOTE — Progress Notes (Signed)
   Subjective:    Patient ID: Jennifer Palmer, female    DOB: 22-Mar-1961, 59 y.o.   MRN: [id number]  HPI  The patient comes in today for a wellness visit.    A review of their health history was completed.  A review of medications was also completed.  Any needed refills; none  Eating habits: bad  Falls/  MVA accidents in past few months: none  Regular exercise: none since heels spurs and surgery  Specialist pt sees on regular basis: none  Preventative health issues were discussed.   Additional concerns: none  Review of Systems     Objective:   Physical Exam        Assessment & Plan:

## 2020-08-04 NOTE — Progress Notes (Signed)
Expand AllCollapse All     Subjective:    Subjective   Patient ID: Jennifer Palmer, female    DOB: 10/02/1961, 59 y.o.   MRN: [id number]  HPI  The patient comes in today for a wellness visit. Patient her current health is fair. She notes feeling depressed lately due to recent changes in her living situation, work schedule, loss of her mother in March and ongoing heel pain. PMH significant for heel spurs bilaterally. She reports having surgery of left heel this year with some relief though pain remains 4-5 on daily basis. She take ibuprofen as needed with minimal relief. She is unable to exercise due to heel pain. 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 Her work schedule is 3am to 3pm which has also been challenging for her.  She reports sleep disturbance since she recently began living with her father due to foundation issues with her home. On average she notes 3-4 hours of uninterrupted sleep. She has not tried any OTC remedies for this. She is also exposed to second hand smoke in the home. She notes her diet "bad" and that due to time restrictions she is not usually able to eat as healthy as she would like. She has not seen a dentist in over a year. She is scheduled for an eye exam today. Not currently sexually active. No new partners.    A review of their health history was completed.  A review of medications was also completed.  Any needed refills; none  Eating habits: bad  Falls/  MVA accidents in past few months: none  Regular exercise: none since heels spurs and surgery  Specialist pt sees on regular basis: none  Preventative health issues were discussed.   Additional concerns: none  Review of Systems Review of Systems  Constitutional: Positive for malaise/fatigue. Negative for chills and fever.  HENT: Positive for congestion. Negative for ear pain, hearing loss, sinus pain, sore throat and tinnitus.     Eyes: Negative for blurred vision and pain.  Respiratory: Negative for cough, shortness of breath and wheezing.   Cardiovascular: Negative for chest pain, palpitations, orthopnea and leg swelling.  Gastrointestinal: Negative for abdominal pain, blood in stool, constipation, diarrhea, heartburn, nausea and vomiting.  Genitourinary: Positive for urgency. Negative for dysuria, frequency and hematuria.       No vaginal bleeding or pelvic pain.   Musculoskeletal: Negative for back pain, falls and neck pain.       Heel pain  Skin: Negative for itching and rash.  Neurological: Negative for dizziness, tingling, weakness and headaches.  Endo/Heme/Allergies: Positive for environmental allergies. Does not bruise/bleed easily.       Smoke  Psychiatric/Behavioral: Positive for depression. Negative for memory loss, substance abuse and suicidal ideas. The patient has insomnia. The patient is not nervous/anxious.       PHQ9 SCORE ONLY 08/04/2020  PHQ-9 Total Score 17   GAD 7 : Generalized Anxiety Score 08/04/2020  Nervous, Anxious, on Edge 2  Control/stop worrying 1  Worry too much - different things 0  Trouble relaxing 0  Restless 0  Easily annoyed or irritable 3  Afraid - awful might happen 0  Total GAD 7 Score 6  Anxiety Difficulty Somewhat difficult       Objective:   Objective  Physical Exam Exam conducted with a chaperone present.  Constitutional:      General: She is not in acute  distress.    Appearance: Normal appearance.  HENT:     Right Ear: Ear canal normal. No drainage or tenderness. There is no impacted cerumen. Tympanic membrane is retracted. Tympanic membrane is not erythematous.     Left Ear: Ear canal normal. No drainage or tenderness. There is no impacted cerumen. Tympanic membrane is retracted. Tympanic membrane is not erythematous.     Nose: Congestion present. No rhinorrhea.     Mouth/Throat:     Mouth: Mucous membranes are moist.     Pharynx: No posterior  oropharyngeal erythema.  Neck:     Thyroid: No thyroid mass, thyromegaly or thyroid tenderness.     Comments: Thyroid non tender to palpation. No mass or goiter noted.  Cardiovascular:     Rate and Rhythm: Regular rhythm. Tachycardia present.     Heart sounds: Normal heart sounds. No murmur heard.  No friction rub. No gallop.   Pulmonary:     Effort: Pulmonary effort is normal. No respiratory distress.     Breath sounds: Normal breath sounds. No wheezing or rhonchi.  Chest:     Breasts:        Right: Normal. No mass, nipple discharge, skin change or tenderness.        Left: Normal. No mass, nipple discharge, skin change or tenderness.  Abdominal:     General: There is no distension.     Palpations: Abdomen is soft. There is no mass.     Tenderness: There is no abdominal tenderness. There is no guarding.  Genitourinary:    General: Normal vulva.     Exam position: Lithotomy position.     Pubic Area: No rash.      Labia:        Right: No rash, tenderness or lesion.        Left: No rash, tenderness or lesion.      Urethra: No urethral pain.     Vagina: No vaginal discharge, erythema, tenderness, bleeding or lesions.     Cervix: No cervical motion tenderness, friability or erythema.     Comments: No palpable ovaries. No tenderness or obvious masses. Limited exam due to body habitus.  Musculoskeletal:     Cervical back: Normal range of motion.  Lymphadenopathy:     Cervical: No cervical adenopathy.     Upper Body:     Right upper body: No supraclavicular, axillary or pectoral adenopathy.     Left upper body: No supraclavicular, axillary or pectoral adenopathy.  Skin:    General: Skin is warm and dry.  Neurological:     Mental Status: She is alert and oriented to person, place, and time.  Psychiatric:        Mood and Affect: Mood normal.        Behavior: Behavior normal.        Thought Content: Thought content normal.        Judgment: Judgment normal.    Today's Vitals    08/04/20 0854  BP: (!) 152/88  Pulse: (!) 110  Temp: 97.6 F (36.4 C)  SpO2: 96%  Weight: 220 lb 9.6 oz (100.1 kg)   Body mass index is 37.87 kg/m.      Assessment & Plan:   Problem List Items Addressed This Visit      Other   Morbid obesity due to excess calories ([place])   Situational depression    Other Visit Diagnoses    Well woman exam    -  Primary   Relevant Orders  IGP, Aptima HPV   Need for vaccination       Relevant Orders   Flu Vaccine QUAD 6+ mos PF IM (Fluarix Quad PF) (Completed)   Screening for human papillomavirus (HPV)       Relevant Orders   IGP, Aptima HPV   Screening for cervical cancer       Relevant Orders   IGP, Aptima HPV   Single episode of elevated blood pressure       Screening for colon cancer       Relevant Orders   Ambulatory referral to Gastroenterology   Screening mammogram for breast cancer       Relevant Orders   MM DIGITAL SCREENING BILATERAL         Discussed elevation in B/P and pulse. Educated on physiological signs of stress. Recheck BP outside of office. Call back if it remains elevated. Defers medication at this time for BP. Discussed PHQ9 screening results and possible need for antidepressant. Shared decision making regarding antidepressant use. Patient to think about recommendations and notify provider of decision. Pap Smear completed today Lab Work Today after visit Proper footwear for adequate support while working Avoid prolonged standing in one position.  Recommend follow up with podiatry if persists.  Adequate rest/ sleep regimen to reduce depression symptoms may use OTC sleep aid Diet modifications: low sugar, reduce fried foods and carbohydrates, increase protein Scheduled Mammogram and Colonoscopy Claritin OTC Daily for sinus pressure Flonase nasal spray for sinus pressure  Return in about 1 year (around 08/04/2021) for physical.

## 2020-08-05 LAB — LIPID PANEL
Chol/HDL Ratio: 3.1 ratio (ref 0.0–4.4)
Cholesterol, Total: 192 mg/dL (ref 100–199)
HDL: 61 mg/dL (ref >39)
LDL Chol Calc (NIH): 119 mg/dL — ABNORMAL HIGH (ref 0–99)
Triglycerides: 63 mg/dL (ref 0–149)
VLDL Cholesterol Cal: 12 mg/dL (ref 5–40)

## 2020-08-05 LAB — CBC WITH DIFFERENTIAL/PLATELET
Basophils Absolute: 0 10*3/uL (ref 0.0–0.2)
Basos: 1 %
EOS (ABSOLUTE): 0.1 10*3/uL (ref 0.0–0.4)
Eos: 2 %
Hematocrit: 41.1 % (ref 34.0–46.6)
Hemoglobin: 13.2 g/dL (ref 11.1–15.9)
Immature Grans (Abs): 0 10*3/uL (ref 0.0–0.1)
Immature Granulocytes: 0 %
Lymphocytes Absolute: 2 10*3/uL (ref 0.7–3.1)
Lymphs: 44 %
MCH: 28.1 pg (ref 26.6–33.0)
MCHC: 32.1 g/dL (ref 31.5–35.7)
MCV: 88 fL (ref 79–97)
Monocytes Absolute: 0.4 10*3/uL (ref 0.1–0.9)
Monocytes: 8 %
Neutrophils Absolute: 2 10*3/uL (ref 1.4–7.0)
Neutrophils: 45 %
Platelets: 294 10*3/uL (ref 150–450)
RBC: 4.69 x10E6/uL (ref 3.77–5.28)
RDW: 12.4 % (ref 11.7–15.4)
WBC: 4.5 10*3/uL (ref 3.4–10.8)

## 2020-08-05 LAB — TSH: TSH: 2.25 u[IU]/mL (ref 0.450–4.500)

## 2020-08-05 LAB — BASIC METABOLIC PANEL
BUN/Creatinine Ratio: 16 (ref 9–23)
BUN: 13 mg/dL (ref 6–24)
CO2: 25 mmol/L (ref 20–29)
Calcium: 9.3 mg/dL (ref 8.7–10.2)
Chloride: 104 mmol/L (ref 96–106)
Creatinine, Ser: 0.83 mg/dL (ref 0.57–1.00)
GFR calc Af Amer: 91 mL/min/{1.73_m2} (ref >59)
GFR calc non Af Amer: 79 mL/min/{1.73_m2} (ref >59)
Glucose: 89 mg/dL (ref 65–99)
Potassium: 4.2 mmol/L (ref 3.5–5.2)
Sodium: 143 mmol/L (ref 134–144)

## 2020-08-05 LAB — HEPATIC FUNCTION PANEL
ALT: 13 IU/L (ref 0–32)
AST: 19 IU/L (ref 0–40)
Albumin: 4.5 g/dL (ref 3.8–4.9)
Alkaline Phosphatase: 88 IU/L (ref 44–121)
Bilirubin Total: 1 mg/dL (ref 0.0–1.2)
Bilirubin, Direct: 0.23 mg/dL (ref 0.00–0.40)
Total Protein: 7.6 g/dL (ref 6.0–8.5)

## 2020-08-05 LAB — VITAMIN D 25 HYDROXY (VIT D DEFICIENCY, FRACTURES): Vit D, 25-Hydroxy: 13.7 ng/mL — ABNORMAL LOW (ref 30.0–100.0)

## 2020-08-07 ENCOUNTER — Encounter: Payer: Self-pay | Admitting: Nurse Practitioner

## 2020-08-07 DIAGNOSIS — F4321 Adjustment disorder with depressed mood: Secondary | ICD-10-CM | POA: Insufficient documentation

## 2020-08-09 LAB — IGP, APTIMA HPV: HPV Aptima: NEGATIVE

## 2020-08-11 ENCOUNTER — Other Ambulatory Visit: Payer: Self-pay | Admitting: Nurse Practitioner

## 2020-08-11 MED ORDER — VITAMIN D (ERGOCALCIFEROL) 1.25 MG (50000 UNIT) PO CAPS: 50000.0000 [IU] | ORAL_CAPSULE | ORAL | 2 refills | Status: DC

## 2020-08-11 MED ORDER — METRONIDAZOLE 500 MG PO TABS: 500.0000 mg | ORAL_TABLET | Freq: Two times a day (BID) | ORAL | 0 refills | Status: DC

## 2020-08-15 ENCOUNTER — Encounter: Payer: Self-pay | Admitting: Internal Medicine

## 2020-08-21 ENCOUNTER — Other Ambulatory Visit: Payer: Self-pay

## 2020-08-21 ENCOUNTER — Ambulatory Visit (HOSPITAL_COMMUNITY)
Admission: RE | Admit: 2020-08-21 | Discharge: 2020-08-21 | Disposition: A | Discharge status: Home / Self Care | Payer: BC Managed Care – PPO | Source: Ambulatory Visit | Attending: Nurse Practitioner | Admitting: Nurse Practitioner

## 2020-08-21 DIAGNOSIS — Z1231 Encounter for screening mammogram for malignant neoplasm of breast: Secondary | ICD-10-CM | POA: Insufficient documentation

## 2020-09-14 ENCOUNTER — Ambulatory Visit: Payer: BC Managed Care – PPO

## 2020-10-10 ENCOUNTER — Ambulatory Visit (INDEPENDENT_AMBULATORY_CARE_PROVIDER_SITE_OTHER): Payer: Self-pay | Admitting: *Deleted

## 2020-10-10 ENCOUNTER — Other Ambulatory Visit: Payer: Self-pay

## 2020-10-10 VITALS — Ht 64.0 in | Wt 220.4 lb

## 2020-10-10 DIAGNOSIS — Z1211 Encounter for screening for malignant neoplasm of colon: Secondary | ICD-10-CM

## 2020-10-10 MED ORDER — NA SULFATE-K SULFATE-MG SULF 17.5-3.13-1.6 GM/177ML PO SOLN: 1.0000 | Freq: Once | ORAL | 0 refills | Status: AC

## 2020-10-10 NOTE — Patient Instructions (Signed)
Jennifer Palmer  [birth year] MRN: [id number]     Procedure Date: 11/03/2020 Time to register: 11:45 am Place to register: [facility] Procedure Time: 1:15 pm Scheduled provider: Dr. Abbey Chatters    PREPARATION FOR COLONOSCOPY WITH SUPREP BOWEL PREP KIT  Note: Suprep Bowel Prep Kit is a split-dose (2day) regimen. Consumption of BOTH 6-ounce bottles is required for a complete prep.  Please notify us immediately if you are diabetic, take iron supplements, or if you are on Coumadin or any other blood thinners.  Please hold the following medications: n/a                                                                                                                                                  2 DAYS BEFORE PROCEDURE:  DATE: 11/01/2020   DAY: Wednesday Begin clear liquid diet AFTER your lunch meal. NO SOLID FOODS after this point.  1 DAY BEFORE PROCEDURE:  DATE: 11/02/2020   DAY: Thursday Continue clear liquids the entire day - NO SOLID FOOD.   Diabetic medications adjustments for today: n/a  At 6:00pm: Complete steps 1 through 4 below, using ONE (1) 6-ounce bottle, before going to bed. Step 1:  Pour ONE (1) 6-ounce bottle of SUPREP liquid into the mixing container.  Step 2:  Add cool drinking water to the 16 ounce line on the container and mix.  Note: Dilute the solution concentrate as directed prior to use. Step 3:  DRINK ALL the liquid in the container. Step 4:  You MUST drink an additional two (2) or more 16 ounce containers of water over the next one (1) hour.   Continue clear liquids.  DAY OF PROCEDURE:   DATE: 11/03/2020   DAY: Friday If you take medications for your heart, blood pressure, or breathing, you may take these medications.  Diabetic medications adjustments for today: n/a  5 hours before your procedure at :  8:15 am Step 1:  Pour ONE (1) 6-ounce bottle of SUPREP liquid into the mixing container.  Step 2:  Add cool drinking water to the 16 ounce line on the  container and mix.  Note: Dilute the solution concentrate as directed prior to use. Step 3:  DRINK ALL the liquid in the container. Step 4:  You MUST drink an additional two (2) or more 16 ounce containers of water over the next one (1) hour. You MUST complete the final glass of water at least 3 hours before your colonoscopy. Nothing by mouth past 10:15 am.  You may take your morning medications with sip of water unless we have instructed otherwise.    Please see below for Dietary Information.  CLEAR LIQUIDS INCLUDE:  Water Jello (NOT red in color)   Ice Popsicles (NOT red in color)   Tea (sugar ok, no milk/cream) Powdered fruit flavored drinks  Coffee (sugar  ok, no milk/cream) Gatorade/ Lemonade/ Kool-Aid  (NOT red in color)   Juice: apple, white grape, white cranberry Soft drinks  Clear bullion, consomme, broth (fat free beef/chicken/vegetable)  Carbonated beverages (any kind)  Strained chicken noodle soup Hard Candy   Remember: Clear liquids are liquids that will allow you to see your fingers on the other side of a clear glass. Be sure liquids are NOT red in color, and not cloudy, but CLEAR.  DO NOT EAT OR DRINK ANY OF THE FOLLOWING:  Dairy products of any kind   Cranberry juice Tomato juice / V8 juice   Grapefruit juice Orange juice     Red grape juice  Do not eat any solid foods, including such foods as: cereal, oatmeal, yogurt, fruits, vegetables, creamed soups, eggs, bread, crackers, pureed foods in a blender, etc.   HELPFUL HINTS FOR DRINKING PREP SOLUTION:   Make sure prep is extremely cold. Mix and refrigerate the the morning of the prep. You may also put in the freezer.   You may try mixing some Crystal Light or Country Time Lemonade if you prefer. Mix in small amounts; add more if necessary.  Try drinking through a straw  Rinse mouth with water or a mouthwash between glasses, to remove after-taste.  Try sipping on a cold beverage /ice/ popsicles between glasses of  prep.  Place a piece of sugar-free hard candy in mouth between glasses.  If you become nauseated, try consuming smaller amounts, or stretch out the time between glasses. Stop for 30-60 minutes, then slowly start back drinking.     OTHER INSTRUCTIONS  You will need a responsible adult at least 58 years of age to accompany you and drive you home. This person must remain in the waiting room during your procedure. The hospital will cancel your procedure if you do not have a responsible adult with you.   1. Wear loose fitting clothing that is easily removed. 2. Leave jewelry and other valuables at home.  3. Remove all body piercing jewelry and leave at home. 4. Total time from sign-in until discharge is approximately 2-3 hours. 5. You should go home directly after your procedure and rest. You can resume normal activities the day after your procedure. 6. The day of your procedure you should not:  Drive  Make legal decisions  Operate machinery  Drink alcohol  Return to work   You may call the office (Dept: [phone]) before 5:00pm, or page the doctor on call [phone]) after 5:00pm, for further instructions, if necessary.   Insurance Information YOU WILL NEED TO CHECK WITH YOUR INSURANCE COMPANY FOR THE BENEFITS OF COVERAGE YOU HAVE FOR THIS PROCEDURE.  UNFORTUNATELY, NOT ALL INSURANCE COMPANIES HAVE BENEFITS TO COVER ALL OR PART OF THESE TYPES OF PROCEDURES.  IT IS YOUR RESPONSIBILITY TO CHECK YOUR BENEFITS, HOWEVER, WE WILL BE GLAD TO ASSIST YOU WITH ANY CODES YOUR INSURANCE COMPANY MAY NEED.    PLEASE NOTE THAT MOST INSURANCE COMPANIES WILL NOT COVER A SCREENING COLONOSCOPY FOR PEOPLE UNDER THE AGE OF 50  IF YOU HAVE BCBS INSURANCE, YOU MAY HAVE BENEFITS FOR A SCREENING COLONOSCOPY BUT IF POLYPS ARE FOUND THE DIAGNOSIS WILL CHANGE AND THEN YOU MAY HAVE A DEDUCTIBLE THAT WILL NEED TO BE MET. SO PLEASE MAKE SURE YOU CHECK YOUR BENEFITS FOR A SCREENING COLONOSCOPY AS WELL AS A  DIAGNOSTIC COLONOSCOPY.

## 2020-10-10 NOTE — Progress Notes (Addendum)
Gastroenterology Pre-Procedure Review  Request Date: 10/10/2020 Requesting Physician: Sherie Don, NP, no previous TCS  PATIENT REVIEW QUESTIONS: The patient responded to the following health history questions as indicated:    1. Diabetes Melitis: no 2. Joint replacements in the past 12 months: no 3. Major health problems in the past 3 months: no 4. Has an artificial valve or MVP: no 5. Has a defibrillator: no 6. Has been advised in past to take antibiotics in advance of a procedure like teeth cleaning: no 7. Family history of colon cancer: no 8. Alcohol Use: yes, 1 glass of wine every 6 months 9. Illicit drug Use: no 10. History of sleep apnea: no 11. History of coronary artery or other vascular stents placed within the last 12 months: no 12. History of any prior anesthesia complications: no 13. Body mass index is 37.83 kg/m.    MEDICATIONS & ALLERGIES:    Patient reports the following regarding taking any blood thinners:   Plavix? no Aspirin? no Coumadin? no Brilinta? no Xarelto? no Eliquis? no Pradaxa? no Savaysa? no Effient? no  Patient confirms/reports the following medications:  Current Outpatient Medications  Medication Sig Dispense Refill   Vitamin D, Ergocalciferol, (DRISDOL) 1.25 MG (50000 UNIT) CAPS capsule Take 1 capsule (50,000 Units total) by mouth every 7 (seven) days. 4 capsule 2   No current facility-administered medications for this visit.    Patient confirms/reports the following allergies:  No Known Allergies  No orders of the defined types were placed in this encounter.   AUTHORIZATION INFORMATION Primary Insurance: [employer],  ID #: [id number],  Group #: [id number] Pre-Cert / Auth required: No, file to local [employer]  SCHEDULE INFORMATION: Procedure has been scheduled as follows:  Date: 11/03/2020, Time: 1:15 Location: [facility] with Dr. Marletta Lor  This Gastroenterology Pre-Precedure Review Form is being routed to the following provider(s): Wynne Dust, NP

## 2020-10-15 NOTE — Progress Notes (Signed)
Ok to schedule.  ASA I/II 

## 2020-10-17 ENCOUNTER — Other Ambulatory Visit: Payer: Self-pay | Admitting: *Deleted

## 2020-11-01 ENCOUNTER — Other Ambulatory Visit: Payer: Self-pay

## 2020-11-01 ENCOUNTER — Other Ambulatory Visit (HOSPITAL_COMMUNITY)
Admission: RE | Admit: 2020-11-01 | Discharge: 2020-11-01 | Disposition: A | Discharge status: Home / Self Care | Payer: BC Managed Care – PPO | Source: Ambulatory Visit | Attending: Internal Medicine | Admitting: Internal Medicine

## 2020-11-01 DIAGNOSIS — D126 Benign neoplasm of colon, unspecified: Secondary | ICD-10-CM | POA: Diagnosis not present

## 2020-11-01 DIAGNOSIS — Z20822 Contact with and (suspected) exposure to covid-19: Secondary | ICD-10-CM | POA: Insufficient documentation

## 2020-11-01 DIAGNOSIS — Z01812 Encounter for preprocedural laboratory examination: Secondary | ICD-10-CM | POA: Insufficient documentation

## 2020-11-01 DIAGNOSIS — Q438 Other specified congenital malformations of intestine: Secondary | ICD-10-CM | POA: Diagnosis not present

## 2020-11-01 DIAGNOSIS — K648 Other hemorrhoids: Secondary | ICD-10-CM | POA: Diagnosis not present

## 2020-11-01 DIAGNOSIS — Z1211 Encounter for screening for malignant neoplasm of colon: Secondary | ICD-10-CM | POA: Diagnosis not present

## 2020-11-02 LAB — SARS CORONAVIRUS 2 (TAT 6-24 HRS): SARS Coronavirus 2: NEGATIVE

## 2020-11-03 ENCOUNTER — Other Ambulatory Visit: Payer: Self-pay

## 2020-11-03 ENCOUNTER — Ambulatory Visit (HOSPITAL_COMMUNITY)
Admission: RE | Admit: 2020-11-03 | Discharge: 2020-11-03 | Disposition: A | Discharge status: Home / Self Care | Payer: BC Managed Care – PPO | Attending: Internal Medicine | Admitting: Internal Medicine

## 2020-11-03 ENCOUNTER — Ambulatory Visit (HOSPITAL_COMMUNITY): Payer: BC Managed Care – PPO | Admitting: Anesthesiology

## 2020-11-03 ENCOUNTER — Encounter (HOSPITAL_COMMUNITY): Payer: Self-pay

## 2020-11-03 ENCOUNTER — Encounter (HOSPITAL_COMMUNITY)
Admission: RE | Disposition: A | Discharge status: Home / Self Care | Payer: Self-pay | Source: Home / Self Care | Attending: Internal Medicine

## 2020-11-03 DIAGNOSIS — Q438 Other specified congenital malformations of intestine: Secondary | ICD-10-CM | POA: Insufficient documentation

## 2020-11-03 DIAGNOSIS — K648 Other hemorrhoids: Secondary | ICD-10-CM | POA: Diagnosis not present

## 2020-11-03 DIAGNOSIS — Z20822 Contact with and (suspected) exposure to covid-19: Secondary | ICD-10-CM | POA: Diagnosis not present

## 2020-11-03 DIAGNOSIS — D490 Neoplasm of unspecified behavior of digestive system: Secondary | ICD-10-CM | POA: Diagnosis not present

## 2020-11-03 DIAGNOSIS — D126 Benign neoplasm of colon, unspecified: Secondary | ICD-10-CM | POA: Insufficient documentation

## 2020-11-03 DIAGNOSIS — K6389 Other specified diseases of intestine: Secondary | ICD-10-CM | POA: Diagnosis not present

## 2020-11-03 DIAGNOSIS — K5669 Other partial intestinal obstruction: Secondary | ICD-10-CM | POA: Diagnosis not present

## 2020-11-03 DIAGNOSIS — Z1211 Encounter for screening for malignant neoplasm of colon: Secondary | ICD-10-CM | POA: Diagnosis not present

## 2020-11-03 HISTORY — PX: COLONOSCOPY WITH PROPOFOL: SHX5780

## 2020-11-03 HISTORY — PX: BIOPSY: SHX5522

## 2020-11-03 SURGERY — COLONOSCOPY WITH PROPOFOL
Anesthesia: General

## 2020-11-03 MED ORDER — SPOT INK MARKER SYRINGE KIT
PACK | SUBMUCOSAL | Status: AC
  Filled 2020-11-03: qty 5

## 2020-11-03 MED ORDER — SPOT INK MARKER SYRINGE KIT
PACK | SUBMUCOSAL | Status: DC | PRN
  Administered 2020-11-03: 5 mL via SUBMUCOSAL

## 2020-11-03 MED ORDER — PROPOFOL 10 MG/ML IV BOLUS
INTRAVENOUS | Status: DC | PRN
  Administered 2020-11-03: 20 mg via INTRAVENOUS

## 2020-11-03 MED ORDER — LACTATED RINGERS IV SOLN: INTRAVENOUS | Status: DC | PRN

## 2020-11-03 MED ORDER — LACTATED RINGERS IV SOLN: INTRAVENOUS | Status: DC

## 2020-11-03 MED ORDER — KETAMINE HCL 50 MG/5ML IJ SOSY
PREFILLED_SYRINGE | INTRAMUSCULAR | Status: AC
  Filled 2020-11-03: qty 5

## 2020-11-03 MED ORDER — GLYCOPYRROLATE 0.2 MG/ML IJ SOLN
INTRAMUSCULAR | Status: DC | PRN
  Administered 2020-11-03: .1 mg via INTRAVENOUS

## 2020-11-03 MED ORDER — EPHEDRINE 5 MG/ML INJ
INTRAVENOUS | Status: AC
  Filled 2020-11-03: qty 20

## 2020-11-03 MED ORDER — LIDOCAINE HCL (CARDIAC) PF 100 MG/5ML IV SOSY
PREFILLED_SYRINGE | INTRAVENOUS | Status: DC | PRN
  Administered 2020-11-03: 50 mg via INTRATRACHEAL

## 2020-11-03 MED ORDER — PROPOFOL 500 MG/50ML IV EMUL
INTRAVENOUS | Status: DC | PRN
  Administered 2020-11-03: 150 ug/kg/min via INTRAVENOUS

## 2020-11-03 MED ORDER — KETAMINE HCL 10 MG/ML IJ SOLN
INTRAMUSCULAR | Status: DC | PRN
  Administered 2020-11-03: 15 mg via INTRAVENOUS

## 2020-11-03 NOTE — Anesthesia Preprocedure Evaluation (Signed)
Anesthesia Evaluation  Patient identified by MRN, date of birth, ID band Patient awake    Reviewed: Allergy & Precautions, H&P , NPO status , Patient's Chart, lab work & pertinent test results, reviewed documented beta blocker date and time   Airway Mallampati: I  TM Distance: >3 FB Neck ROM: full    Dental no notable dental hx.    Pulmonary neg pulmonary ROS,    Pulmonary exam normal breath sounds clear to auscultation       Cardiovascular Exercise Tolerance: Good negative cardio ROS   Rhythm:regular Rate:Normal     Neuro/Psych negative neurological ROS  negative psych ROS   GI/Hepatic negative GI ROS, Neg liver ROS,   Endo/Other  negative endocrine ROS  Renal/GU negative Renal ROS  negative genitourinary   Musculoskeletal   Abdominal   Peds  Hematology negative hematology ROS (+)   Anesthesia Other Findings   Reproductive/Obstetrics negative OB ROS                             Anesthesia Physical Anesthesia Plan  ASA: I  Anesthesia Plan: General   Post-op Pain Management:    Induction:   PONV Risk Score and Plan: Propofol infusion and TIVA  Airway Management Planned:   Additional Equipment:   Intra-op Plan:   Post-operative Plan:   Informed Consent: I have reviewed the patients History and Physical, chart, labs and discussed the procedure including the risks, benefits and alternatives for the proposed anesthesia with the patient or authorized representative who has indicated his/her understanding and acceptance.     Dental Advisory Given  Plan Discussed with: CRNA  Anesthesia Plan Comments:         Anesthesia Quick Evaluation

## 2020-11-03 NOTE — Transfer of Care (Signed)
Immediate Anesthesia Transfer of Care Note  Patient: Jennifer Palmer  Procedure(s) Performed: COLONOSCOPY WITH PROPOFOL (N/A ) BIOPSY  Patient Location: PACU  Anesthesia Type:General  Level of Consciousness: awake, alert  and oriented  Airway & Oxygen Therapy: Patient Spontanous Breathing  Post-op Assessment: Report given to RN and Post -op Vital signs reviewed and stable  Post vital signs: Reviewed and stable  Last Vitals:  Vitals Value Taken Time  BP    Temp    Pulse    Resp    SpO2      Last Pain:  Vitals:   11/03/20 1250  TempSrc:   PainSc: 0-No pain      Patients Stated Pain Goal: 0 (17/40/81 4481)  Complications: No complications documented.

## 2020-11-03 NOTE — Discharge Instructions (Addendum)
Colonoscopy Discharge Instructions  Read the instructions outlined below and refer to this sheet in the next few weeks. These discharge instructions provide you with general information on caring for yourself after you leave the hospital. Your doctor may also give you specific instructions. While your treatment has been planned according to the most current medical practices available, unavoidable complications occasionally occur.   ACTIVITY  You may resume your regular activity, but move at a slower pace for the next 24 hours.   Take frequent rest periods for the next 24 hours.   Walking will help get rid of the air and reduce the bloated feeling in your belly (abdomen).   No driving for 24 hours (because of the medicine (anesthesia) used during the test).    Do not sign any important legal documents or operate any machinery for 24 hours (because of the anesthesia used during the test).  NUTRITION  Drink plenty of fluids.   You may resume your normal diet as instructed by your doctor.   Begin with a light meal and progress to your normal diet. Heavy or fried foods are harder to digest and may make you feel sick to your stomach (nauseated).   Avoid alcoholic beverages for 24 hours or as instructed.  MEDICATIONS  You may resume your normal medications unless your doctor tells you otherwise.  WHAT YOU CAN EXPECT TODAY  Some feelings of bloating in the abdomen.   Passage of more gas than usual.   Spotting of blood in your stool or on the toilet paper.  IF YOU HAD POLYPS REMOVED DURING THE COLONOSCOPY:  No aspirin products for 7 days or as instructed.   No alcohol for 7 days or as instructed.   Eat a soft diet for the next 24 hours.  FINDING OUT THE RESULTS OF YOUR TEST Not all test results are available during your visit. If your test results are not back during the visit, make an appointment with your caregiver to find out the results. Do not assume everything is normal if  you have not heard from your caregiver or the medical facility. It is important for you to follow up on all of your test results.  SEEK IMMEDIATE MEDICAL ATTENTION IF:  You have more than a spotting of blood in your stool.   Your belly is swollen (abdominal distention).   You are nauseated or vomiting.   You have a temperature over 101.   You have abdominal pain or discomfort that is severe or gets worse throughout the day.   Unfortunately, it appears you have a large mass on the left side of your colon.  I am concerned for underlying colon cancer.  I took biopsies and we should have these results back Monday.  I have already discussed the case with our surgeon who I will refer you to next week.  We will also likely need to complete CT imaging of your chest abdomen pelvis.  I will call you Monday as soon as I have these results.  I hope you have a great rest of your week!  Elon Alas. Abbey Chatters, D.O. Gastroenterology and Hepatology [facility]      Monitored Anesthesia Care, Care After This sheet gives you information about how to care for yourself after your procedure. Your health care provider may also give you more specific instructions. If you have problems or questions, contact your health care provider. What can I expect after the procedure? After the procedure, it is common to  have:  Tiredness.  Forgetfulness about what happened after the procedure.  Impaired judgment for important decisions.  Nausea or vomiting.  Some difficulty with balance. Follow these instructions at home: For the time period you were told by your health care provider:  Rest as needed.  Do not participate in activities where you could fall or become injured.  Do not drive or use machinery.  Do not drink alcohol.  Do not take sleeping pills or medicines that cause drowsiness.  Do not make important decisions or sign legal documents.  Do not take care of children  on your own.      Eating and drinking  Follow the diet that is recommended by your health care provider.  Drink enough fluid to keep your urine pale yellow.  If you vomit: ? Drink water, juice, or soup when you can drink without vomiting. ? Make sure you have little or no nausea before eating solid foods. General instructions  Have a responsible adult stay with you for the time you are told. It is important to have someone help care for you until you are awake and alert.  Take over-the-counter and prescription medicines only as told by your health care provider.  If you have sleep apnea, surgery and certain medicines can increase your risk for breathing problems. Follow instructions from your health care provider about wearing your sleep device: ? Anytime you are sleeping, including during daytime naps. ? While taking prescription pain medicines, sleeping medicines, or medicines that make you drowsy.  Avoid smoking.  Keep all follow-up visits as told by your health care provider. This is important. Contact a health care provider if:  You keep feeling nauseous or you keep vomiting.  You feel light-headed.  You are still sleepy or having trouble with balance after 24 hours.  You develop a rash.  You have a fever.  You have redness or swelling around the IV site. Get help right away if:  You have trouble breathing.  You have new-onset confusion at home. Summary  For several hours after your procedure, you may feel tired. You may also be forgetful and have poor judgment.  Have a responsible adult stay with you for the time you are told. It is important to have someone help care for you until you are awake and alert.  Rest as told. Do not drive or operate machinery. Do not drink alcohol or take sleeping pills.  Get help right away if you have trouble breathing, or if you suddenly become confused. This information is not intended to replace advice given to you by your  health care provider. Make sure you discuss any questions you have with your health care provider. Document Revised: 06/08/2020 Document Reviewed: 08/26/2019 Elsevier Patient Education  2021 [employer].

## 2020-11-03 NOTE — Op Note (Signed)
[facility] Patient Name: Jennifer Palmer Procedure Date: 11/03/2020 12:36 PM MRN: [id number] Date of Birth: Jul 03, 1961 Attending MD: Elon Alas. Edgar Frisk CSN: [id number] Age: 60 Admit Type: Outpatient Procedure:                Colonoscopy Indications:              Screening for colorectal malignant neoplasm Providers:                Elon Alas. Abbey Chatters, DO, Gwenlyn Fudge, RN, Aram Candela Referring MD:              Medicines:                See the Anesthesia note for documentation of the                            administered medications Complications:            No immediate complications. Estimated Blood Loss:     Estimated blood loss was minimal. Procedure:                Pre-Anesthesia Assessment:                           - The anesthesia plan was to use monitored                            anesthesia care (MAC).                           After obtaining informed consent, the colonoscope                            was passed under direct vision. Throughout the                            procedure, the patient's blood pressure, pulse, and                            oxygen saturations were monitored continuously. The                            PCF-[id number] ([id number]) was introduced through the                            anus and advanced to the the cecum, identified by                            appendiceal orifice and ileocecal valve. The                            colonoscopy was technically difficult and complex                            due to a redundant colon, significant looping and  a                            tortuous colon. Successful completion of the                            procedure was aided by changing the patient to a                            supine position and using manual pressure. The                            patient tolerated the procedure well. The quality                            of the bowel preparation was evaluated  using the                            BBPS [facility] Bowel Preparation Scale) with scores                            of: Right Colon = 2 (minor amount of residual                            staining, small fragments of stool and/or opaque                            liquid, but mucosa seen well), Transverse Colon = 2                            (minor amount of residual staining, small fragments                            of stool and/or opaque liquid, but mucosa seen                            well) and Left Colon = 2 (minor amount of residual                            staining, small fragments of stool and/or opaque                            liquid, but mucosa seen well). The total BBPS score                            equals 6. The quality of the bowel preparation was                            fair. Scope In: 40:98:11 PM Scope Out: 1:23:56 PM Scope Withdrawal Time: 0 hours 11 minutes 5 seconds  Total Procedure Duration: 0 hours 27 minutes 15 seconds  Findings:      The perianal and digital rectal examinations were normal.  Non-bleeding internal hemorrhoids were found during endoscopy.      An infiltrative, polypoid and submucosal partially obstructing large       mass was found in the sigmoid colon. The mass was non-circumferential.       Oozing was present. Biopsies were taken with a cold forceps for       histology. Area distal to mass was tattooed with an injection of 5 mL of       Spot (carbon black) in a 3 quadrant fasion.      The colon (entire examined portion) was significantly redundant.       Advancing the scope required changing the patient to a supine position       and using manual pressure. Impression:               - Preparation of the colon was fair.                           - Non-bleeding internal hemorrhoids.                           - Likely malignant partially obstructing tumor in                            the sigmoid colon. Biopsied.                            - Redundant colon. Moderate Sedation:      Per Anesthesia Care Recommendation:           - Patient has a contact number available for                            emergencies. The signs and symptoms of potential                            delayed complications were discussed with the                            patient. Return to normal activities tomorrow.                            Written discharge instructions were provided to the                            patient.                           - Resume previous diet.                           - Continue present medications.                           - Await pathology results.                           - Repeat colonoscopy after studies are complete for  surveillance based on pathology results.                           - We will refer you to surgery to discuss surgical                            resection. Pending pathology will likely need                            staging with CT chest/abd/pelvis. Procedure Code(s):        --- Professional ---                           [phone], Colonoscopy, flexible; with biopsy, single                            or multiple Diagnosis Code(s):        --- Professional ---                           Z12.11, Encounter for screening for malignant                            neoplasm of colon                           K64.8, Other hemorrhoids                           D49.0, Neoplasm of unspecified behavior of                            digestive system                           K56.690, Other partial intestinal obstruction                           Q43.8, Other specified congenital malformations of                            intestine CPT copyright 2019 American Medical Association. All rights reserved. The codes documented in this report are preliminary and upon coder review may  be revised to meet current compliance requirements. Elon Alas. Abbey Chatters, DO [place] Abbey Chatters,  DO 11/03/2020 1:32:24 PM This report has been signed electronically. Number of Addenda: 0

## 2020-11-03 NOTE — H&P (Signed)
Primary Care Physician:  Kathyrn Drown, MD Primary Gastroenterologist:  Dr. Abbey Chatters  Pre-Procedure History & Physical: HPI:  Jennifer Palmer is a 60 y.o. female is here for a colonoscopy for colon cancer screening purposes.  Patient denies any family history of colorectal cancer.  No melena or hematochezia.  No abdominal pain or unintentional weight loss.  No change in bowel habits.  Overall feels well from a GI standpoint.  Past Medical History:  Diagnosis Date  . Heel spur, left     Past Surgical History:  Procedure Laterality Date  . HEEL SPUR RESECTION Left 09/15/2019   Procedure: HEEL SPUR RESECTION WITH REPAIR & REATTACHMENT OF ACHILLES TENDON;  Surgeon: Tyson Babinski, DPM;  Location: [facility];  Service: Podiatry;  Laterality: Left;    Prior to Admission medications   Medication Sig Start Date End Date Taking? Authorizing Provider  loratadine (CLARITIN) 10 MG tablet Take 10 mg by mouth daily as needed for allergies.   Yes [provider]  Vitamin D, Ergocalciferol, (DRISDOL) 1.25 MG (50000 UNIT) CAPS capsule Take 1 capsule (50,000 Units total) by mouth every 7 (seven) days. Patient taking differently: Take 50,000 Units by mouth every Wednesday. 08/11/20  Yes Nilda Simmer, NP    Allergies as of 10/16/2020  . (No Known Allergies)    Family History  Problem Relation Age of Onset  . Kidney disease Mother   . Alcohol abuse Father     Social History   Socioeconomic History  . Marital status: Single    Spouse name: Not on file  . Number of children: Not on file  . Years of education: Not on file  . Highest education level: Not on file  Occupational History  . Not on file  Tobacco Use  . Smoking status: Never Smoker  . Smokeless tobacco: Never Used  Vaping Use  . Vaping Use: Never used  Substance and Sexual Activity  . Alcohol use: Yes    Comment: rarely  . Drug use: No  . Sexual activity: Yes    Birth control/protection: None  Other Topics  Concern  . Not on file  Social History Narrative  . Not on file   Social Determinants of Health   Financial Resource Strain: Not on file  Food Insecurity: Not on file  Transportation Needs: Not on file  Physical Activity: Not on file  Stress: Not on file  Social Connections: Not on file  Intimate Partner Violence: Not on file    Review of Systems: See HPI, otherwise negative ROS  Impression/Plan: Jennifer Palmer is here for a colonoscopy to be performed for colon cancer screening purposes.  The risks of the procedure including infection, bleed, or perforation as well as benefits, limitations, alternatives and imponderables have been reviewed with the patient. Questions have been answered. All parties agreeable.

## 2020-11-03 NOTE — Anesthesia Postprocedure Evaluation (Signed)
Anesthesia Post Note  Patient: ERLINE SIDDOWAY  Procedure(s) Performed: COLONOSCOPY WITH PROPOFOL (N/A ) BIOPSY  Patient location during evaluation: PACU Anesthesia Type: General Level of consciousness: awake and alert Pain management: pain level controlled Vital Signs Assessment: post-procedure vital signs reviewed and stable Respiratory status: spontaneous breathing, nonlabored ventilation and respiratory function stable Cardiovascular status: stable Postop Assessment: no apparent nausea or vomiting Anesthetic complications: no   No complications documented.   Last Vitals:  Vitals:   11/03/20 1209  BP: (!) 148/84  Pulse: 76  Resp: 16  Temp: 37.1 C  SpO2: 99%    Last Pain:  Vitals:   11/03/20 1250  TempSrc:   PainSc: 0-No pain                 Joanna Hall Hristova

## 2020-11-06 ENCOUNTER — Other Ambulatory Visit: Payer: Self-pay | Admitting: Nurse Practitioner

## 2020-11-06 LAB — SURGICAL PATHOLOGY

## 2020-11-06 NOTE — Telephone Encounter (Signed)
It is my understanding that this was a temporary measure, refused please

## 2020-11-07 ENCOUNTER — Encounter (HOSPITAL_COMMUNITY): Payer: Self-pay | Admitting: Internal Medicine

## 2020-11-08 ENCOUNTER — Telehealth: Payer: Self-pay | Admitting: Internal Medicine

## 2020-11-08 DIAGNOSIS — R933 Abnormal findings on diagnostic imaging of other parts of digestive tract: Secondary | ICD-10-CM

## 2020-11-08 NOTE — Addendum Note (Signed)
Addended by: Cheron Every on: 11/08/2020 04:42 PM   Modules accepted: Orders

## 2020-11-08 NOTE — Telephone Encounter (Signed)
Referral sent 

## 2020-11-08 NOTE — Telephone Encounter (Signed)
Noted   2. Routed to [place] to be referred.

## 2020-11-08 NOTE — Telephone Encounter (Signed)
I was able to get a hold of patient and discussed her pathology results.  Can we refer her to Dr. Runell Gess. Constance Haw to discuss colon resection?  She does request that they contact her after 3 PM as she is not allowed to carry her cell phone at work.  Thank you

## 2020-11-21 ENCOUNTER — Encounter (INDEPENDENT_AMBULATORY_CARE_PROVIDER_SITE_OTHER): Payer: Self-pay

## 2020-11-21 ENCOUNTER — Ambulatory Visit (INDEPENDENT_AMBULATORY_CARE_PROVIDER_SITE_OTHER): Payer: BC Managed Care – PPO | Admitting: General Surgery

## 2020-11-21 ENCOUNTER — Other Ambulatory Visit: Payer: Self-pay

## 2020-11-21 ENCOUNTER — Encounter: Payer: Self-pay | Admitting: General Surgery

## 2020-11-21 VITALS — BP 138/69 | HR 68 | Temp 97.6°F | Resp 14 | Ht 64.0 in | Wt 225.0 lb

## 2020-11-21 DIAGNOSIS — K6389 Other specified diseases of intestine: Secondary | ICD-10-CM

## 2020-11-21 MED ORDER — NEOMYCIN SULFATE 500 MG PO TABS: 1000.0000 mg | ORAL_TABLET | Freq: Three times a day (TID) | ORAL | 0 refills | Status: DC

## 2020-11-21 MED ORDER — SUTAB 1479-225-188 MG PO TABS: 1.0000 | ORAL_TABLET | Freq: Once | ORAL | 0 refills | Status: AC

## 2020-11-21 MED ORDER — METRONIDAZOLE 500 MG PO TABS: 500.0000 mg | ORAL_TABLET | Freq: Three times a day (TID) | ORAL | 0 refills | Status: DC

## 2020-11-22 NOTE — Progress Notes (Signed)
Jennifer Palmer; [id number]; Jun 18, 1961   HPI Patient is a 60 year old black female who was referred to my care by Dr. Sallee Lange and Dr. Hurshel Keys for evaluation and treatment of a sigmoid colon mass.  Patient underwent screening colonoscopy and was found to have a near obstructing mass at 35 cm from the anus.  Initial biopsies were negative for malignancy.  Patient states that she has noted her bowel movements to be somewhat looser over the past few months.  She denies any blood per rectum.  She denies any abdominal pain. Past Medical History:  Diagnosis Date  . Heel spur, left     Past Surgical History:  Procedure Laterality Date  . BIOPSY  11/03/2020   Procedure: BIOPSY;  Surgeon: Eloise Harman, DO;  Location: [facility];  Service: Endoscopy;;  . COLONOSCOPY WITH PROPOFOL N/A 11/03/2020   Procedure: COLONOSCOPY WITH PROPOFOL;  Surgeon: Eloise Harman, DO;  Location: [facility];  Service: Endoscopy;  Laterality: N/A;  1:15/ASA I/II  . HEEL SPUR RESECTION Left 09/15/2019   Procedure: HEEL SPUR RESECTION WITH REPAIR & REATTACHMENT OF ACHILLES TENDON;  Surgeon: Tyson Babinski, DPM;  Location: [facility];  Service: Podiatry;  Laterality: Left;    Family History  Problem Relation Age of Onset  . Kidney disease Mother   . Alcohol abuse Father     Current Outpatient Medications on File Prior to Visit  Medication Sig Dispense Refill  . Vitamin D, Ergocalciferol, (DRISDOL) 1.25 MG (50000 UNIT) CAPS capsule Take 1 capsule (50,000 Units total) by mouth every 7 (seven) days. (Patient taking differently: Take 50,000 Units by mouth every Wednesday.) 4 capsule 2  . loratadine (CLARITIN) 10 MG tablet Take 10 mg by mouth daily as needed for allergies. (Patient not taking: Reported on 11/21/2020)     No current facility-administered medications on file prior to visit.    No Known Allergies  Social History   Substance and Sexual Activity  Alcohol Use Yes   Comment: rarely     Social History   Tobacco Use  Smoking Status Never Smoker  Smokeless Tobacco Never Used    Review of Systems  Constitutional: Negative.   HENT: Positive for sinus pain.   Eyes: Negative.   Respiratory: Negative.   Cardiovascular: Negative.   Gastrointestinal: Negative.   Genitourinary: Negative.   Musculoskeletal: Negative.   Skin: Negative.   Neurological: Negative.   Endo/Heme/Allergies: Negative.   Psychiatric/Behavioral: Negative.     Objective   Vitals:   11/21/20 1354  BP: 138/69  Pulse: 68  Resp: 14  Temp: 97.6 F (36.4 C)  SpO2: 94%    Physical Exam Vitals reviewed.  Constitutional:      Appearance: Normal appearance. She is not ill-appearing.  HENT:     Head: Normocephalic and atraumatic.  Cardiovascular:     Rate and Rhythm: Normal rate.     Heart sounds: Normal heart sounds. No murmur heard. No friction rub. No gallop.   Pulmonary:     Effort: Pulmonary effort is normal. No respiratory distress.     Breath sounds: Normal breath sounds. No stridor. No wheezing, rhonchi or rales.  Abdominal:     General: Bowel sounds are normal. There is no distension.     Palpations: Abdomen is soft. There is no mass.     Tenderness: There is no abdominal tenderness. There is no guarding or rebound.     Hernia: No hernia is present.  Skin:    General: Skin is  warm and dry.  Neurological:     Mental Status: She is alert and oriented to person, place, and time.   Dr. Ave Filter colonoscopy report and pathology report reviewed  Assessment  Sigmoid colon mass, near obstructing Plan   Patient is scheduled for low anterior resection on 12/11/2020.  The risks and benefits of the procedure including bleeding, infection, cardiopulmonary difficulties, anastomotic leak, and the possibility of malignancy were fully explained to the patient, who gave informed consent.  Sutabs, neomycin, and Flagyl have been ordered preoperatively.

## 2020-11-22 NOTE — H&P (Signed)
Jennifer Palmer; [id number]; 07/12/1961   HPI Patient is a 60 year old black female who was referred to my care by Dr. Sallee Lange and Dr. Hurshel Keys for evaluation and treatment of a sigmoid colon mass.  Patient underwent screening colonoscopy and was found to have a near obstructing mass at 35 cm from the anus.  Initial biopsies were negative for malignancy.  Patient states that she has noted her bowel movements to be somewhat looser over the past few months.  She denies any blood per rectum.  She denies any abdominal pain. Past Medical History:  Diagnosis Date  . Heel spur, left     Past Surgical History:  Procedure Laterality Date  . BIOPSY  11/03/2020   Procedure: BIOPSY;  Surgeon: Eloise Harman, DO;  Location: [facility];  Service: Endoscopy;;  . COLONOSCOPY WITH PROPOFOL N/A 11/03/2020   Procedure: COLONOSCOPY WITH PROPOFOL;  Surgeon: Eloise Harman, DO;  Location: [facility];  Service: Endoscopy;  Laterality: N/A;  1:15/ASA I/II  . HEEL SPUR RESECTION Left 09/15/2019   Procedure: HEEL SPUR RESECTION WITH REPAIR & REATTACHMENT OF ACHILLES TENDON;  Surgeon: Tyson Babinski, DPM;  Location: [facility];  Service: Podiatry;  Laterality: Left;    Family History  Problem Relation Age of Onset  . Kidney disease Mother   . Alcohol abuse Father     Current Outpatient Medications on File Prior to Visit  Medication Sig Dispense Refill  . Vitamin D, Ergocalciferol, (DRISDOL) 1.25 MG (50000 UNIT) CAPS capsule Take 1 capsule (50,000 Units total) by mouth every 7 (seven) days. (Patient taking differently: Take 50,000 Units by mouth every Wednesday.) 4 capsule 2  . loratadine (CLARITIN) 10 MG tablet Take 10 mg by mouth daily as needed for allergies. (Patient not taking: Reported on 11/21/2020)     No current facility-administered medications on file prior to visit.    No Known Allergies  Social History   Substance and Sexual Activity  Alcohol Use Yes   Comment: rarely     Social History   Tobacco Use  Smoking Status Never Smoker  Smokeless Tobacco Never Used    Review of Systems  Constitutional: Negative.   HENT: Positive for sinus pain.   Eyes: Negative.   Respiratory: Negative.   Cardiovascular: Negative.   Gastrointestinal: Negative.   Genitourinary: Negative.   Musculoskeletal: Negative.   Skin: Negative.   Neurological: Negative.   Endo/Heme/Allergies: Negative.   Psychiatric/Behavioral: Negative.     Objective   Vitals:   11/21/20 1354  BP: 138/69  Pulse: 68  Resp: 14  Temp: 97.6 F (36.4 C)  SpO2: 94%    Physical Exam Vitals reviewed.  Constitutional:      Appearance: Normal appearance. She is not ill-appearing.  HENT:     Head: Normocephalic and atraumatic.  Cardiovascular:     Rate and Rhythm: Normal rate.     Heart sounds: Normal heart sounds. No murmur heard. No friction rub. No gallop.   Pulmonary:     Effort: Pulmonary effort is normal. No respiratory distress.     Breath sounds: Normal breath sounds. No stridor. No wheezing, rhonchi or rales.  Abdominal:     General: Bowel sounds are normal. There is no distension.     Palpations: Abdomen is soft. There is no mass.     Tenderness: There is no abdominal tenderness. There is no guarding or rebound.     Hernia: No hernia is present.  Skin:    General: Skin is  warm and dry.  Neurological:     Mental Status: She is alert and oriented to person, place, and time.   Dr. Ave Filter colonoscopy report and pathology report reviewed  Assessment  Sigmoid colon mass, near obstructing Plan   Patient is scheduled for low anterior resection on 12/11/2020.  The risks and benefits of the procedure including bleeding, infection, cardiopulmonary difficulties, anastomotic leak, and the possibility of malignancy were fully explained to the patient, who gave informed consent.  Sutabs, neomycin, and Flagyl have been ordered preoperatively.

## 2020-12-07 NOTE — Patient Instructions (Signed)
Jennifer Palmer  12/07/2020     @PREFPERIOPPHARMACY @   Your procedure is scheduled on  12/11/2020   Report to [facility] at  [place]   Call this number if you have problems the morning of surgery:  [phone]   Remember:  Follow the diet and prep instructions given to you by Dr Arnoldo Morale.                      Take these medicines the morning of surgery with A SIP OF WATER    None.  Place clean sheets on your bed the night before your procedure and DO NOT sleep with pets this night.  Shower the night before and the morning of your procedure with CHG. DO NOT put CHG on your face, hair or genitals.  After each shower, dry off with a clean towel, put on clean, comfortable clothes and brush your teeth.      Do not wear jewelry, make-up or nail polish.  Do not wear lotions, powders, or perfumes, or deodorant.  Do not shave 48 hours prior to surgery.  Men may shave face and neck.  Do not bring valuables to the hospital.  [facility] is not responsible for any belongings or valuables.  Contacts, dentures or bridgework may not be worn into surgery.  Leave your suitcase in the car.  After surgery it may be brought to your room.  For patients admitted to the hospital, discharge time will be determined by your treatment team.  Patients discharged the day of surgery will not be allowed to drive home and must have someone with them for 24 hours.   Special instructions:  DO NOT smoke tobacco or vape the morning of your procedure.   Please read over the following fact sheets that you were given. Pain Booklet, Coughing and Deep Breathing, Surgical Site Infection Prevention, Anesthesia Post-op Instructions and Care and Recovery After Surgery       Open Total Colectomy, Care After This sheet gives you information about how to care for yourself after your procedure. Your health care provider may also give you more specific instructions. If you have problems or questions,  contact your health care provider. What can I expect after the procedure? After the procedure, it is common to have:  Pain in your abdomen, especially along your incision.  Tiredness. Your energy level will return to normal over the next several weeks.  Constipation.  Nausea.  Difficulty urinating. Follow these instructions at home: Medicines  Take over-the-counter and prescription medicines, including stool softeners, only as told by your health care provider.  Ask your health care provider if the medicine prescribed to you requires you to avoid driving or using machinery. Eating and drinking  Follow instructions from your health care provider about eating or drinking restrictions.  Eat a low-fiber diet for the first 4 weeks after surgery. ? Most people on a low-fiber eating plan should eat less than 10 grams (g) of fiber a day. Follow recommendations from your health care provider or dietitian about how much fiber you should have each day. ? Always check food labels to know the fiber content of packaged foods. In general, a low-fiber food will have fewer than 2 g of fiber a serving. ? In general, try to avoid whole grains, raw fruits and vegetables, dried fruit, tough cuts of meat, nuts, and seeds. Incision care  Follow instructions from your health care provider about  how to take care of your incision. Make sure you: ? Wash your hands with soap and water for at least 20 seconds before and after you change your bandage (dressing). If soap and water are not available, use hand sanitizer. ? Change your dressing as told by your health care provider. ? Leave stitches (sutures), staples, or adhesive strips in place. These skin closures may need to stay in place for 2 weeks or longer. If adhesive strip edges start to loosen and curl up, you may trim the loose edges. Do not remove adhesive strips completely unless your health care provider tells you to do that.  Avoid wearing tight  clothing around your incision.  Protect your incision area from the sun.  Do not take baths, swim, or use a hot tub until your health care provider approves. Ask your health care provider if you may take showers. You may only be allowed to take sponge baths.  Check your incision area every day for signs of infection. Check for: ? More redness, swelling, or pain. ? Fluid or blood. ? Warmth. ? Pus or a bad smell.   Managing constipation Your procedure may cause constipation. To prevent or treat constipation, you may need to:  Drink enough fluid to keep your urine pale yellow.  Take over-the-counter or prescription medicines. Activity  Rest as told by your health care provider.  Avoid sitting for a long time without moving. Get up to take short walks every 1-2 hours. This is important to improve blood flow and breathing. Ask for help if you feel weak or unsteady.  Avoid activities that require a lot of energy for 4-6 weeks after surgery, such as running, climbing, and lifting heavy objects.  You may need to continue to do breathing exercises.  Do not lift anything that is heavier than 10 lb (4.5 kg), or the limit that you are told, until your health care provider says that it is safe.  Do not drive until you are no longer taking prescription pain medicines and you feel you are able to drive safely. This might take up to 4 weeks.  Return to your normal activities as told by your health care provider. Ask your health care provider what activities are safe for you.   General instructions  If you have an opening (stoma) in your abdomen, follow instructions from your health care provider about how to care for the stoma and the external pouch attached to it (ostomy pouch).  Do not use any products that contain nicotine or tobacco, such as cigarettes, e-cigarettes, and chewing tobacco. These can delay incision healing after surgery. If you need help quitting, ask your health care  provider.  Keep all follow-up visits as told by your health care provider. This is important. Contact a health care provider if:  You have any of these signs of infection: ? More redness, swelling, or pain around your incision. ? Fluid or blood coming from your incision. ? Warmth coming from your incision. ? Pus or a bad smell coming from your incision. ? A fever or chills.  You do not have a bowel movement within 2-3 days after surgery.  You cannot eat or drink for 24 hours or longer.  You have nausea and vomiting that will not go away.  You have pain in your abdomen that gets worse and does not get better with medicine. Get help right away if:  You have chest pain.  You have shortness of breath.  You have pain  or swelling in your legs.  Your incision breaks open after your sutures, staples, or adhesive strips have been removed.  You have bleeding from the rectum. These symptoms may represent a serious problem that is an emergency. Do not wait to see if the symptoms will go away. Get medical help right away. Call your local emergency services (911 in the [place] Do not drive yourself to the hospital. Summary  After your procedure, it is common to have pain, tiredness, constipation, nausea, and difficulty urinating.  If you have an opening (stoma) in your abdomen, follow instructions from your health care provider about how to care for the stoma and the external pouch attached to it (ostomy pouch).  Follow instructions from your health care provider about eating and drinking and about how to take care of your incision.  Keep all follow-up visits as told by your health care provider. This is important. This information is not intended to replace advice given to you by your health care provider. Make sure you discuss any questions you have with your health care provider. Document Revised: 09/01/2019 Document Reviewed: 09/01/2019 Elsevier Patient Education  2021 [employer] Anesthesia, Adult, Care After This sheet gives you information about how to care for yourself after your procedure. Your health care provider may also give you more specific instructions. If you have problems or questions, contact your health care provider. What can I expect after the procedure? After the procedure, the following side effects are common:  Pain or discomfort at the IV site.  Nausea.  Vomiting.  Sore throat.  Trouble concentrating.  Feeling cold or chills.  Feeling weak or tired.  Sleepiness and fatigue.  Soreness and body aches. These side effects can affect parts of the body that were not involved in surgery. Follow these instructions at home: For the time period you were told by your health care provider:  Rest.  Do not participate in activities where you could fall or become injured.  Do not drive or use machinery.  Do not drink alcohol.  Do not take sleeping pills or medicines that cause drowsiness.  Do not make important decisions or sign legal documents.  Do not take care of children on your own.   Eating and drinking  Follow any instructions from your health care provider about eating or drinking restrictions.  When you feel hungry, start by eating small amounts of foods that are soft and easy to digest (bland), such as toast. Gradually return to your regular diet.  Drink enough fluid to keep your urine pale yellow.  If you vomit, rehydrate by drinking water, juice, or clear broth. General instructions  If you have sleep apnea, surgery and certain medicines can increase your risk for breathing problems. Follow instructions from your health care provider about wearing your sleep device: ? Anytime you are sleeping, including during daytime naps. ? While taking prescription pain medicines, sleeping medicines, or medicines that make you drowsy.  Have a responsible adult stay with you for the time you are told. It is important to  have someone help care for you until you are awake and alert.  Return to your normal activities as told by your health care provider. Ask your health care provider what activities are safe for you.  Take over-the-counter and prescription medicines only as told by your health care provider.  If you smoke, do not smoke without supervision.  Keep all follow-up visits as told by your health care provider. This is important.  Contact a health care provider if:  You have nausea or vomiting that does not get better with medicine.  You cannot eat or drink without vomiting.  You have pain that does not get better with medicine.  You are unable to pass urine.  You develop a skin rash.  You have a fever.  You have redness around your IV site that gets worse. Get help right away if:  You have difficulty breathing.  You have chest pain.  You have blood in your urine or stool, or you vomit blood. Summary  After the procedure, it is common to have a sore throat or nausea. It is also common to feel tired.  Have a responsible adult stay with you for the time you are told. It is important to have someone help care for you until you are awake and alert.  When you feel hungry, start by eating small amounts of foods that are soft and easy to digest (bland), such as toast. Gradually return to your regular diet.  Drink enough fluid to keep your urine pale yellow.  Return to your normal activities as told by your health care provider. Ask your health care provider what activities are safe for you. This information is not intended to replace advice given to you by your health care provider. Make sure you discuss any questions you have with your health care provider. Document Revised: 06/08/2020 Document Reviewed: 01/06/2020 Elsevier Patient Education  2021 [employer].

## 2020-12-08 ENCOUNTER — Other Ambulatory Visit: Payer: Self-pay

## 2020-12-08 ENCOUNTER — Other Ambulatory Visit (HOSPITAL_COMMUNITY)
Admission: RE | Admit: 2020-12-08 | Discharge: 2020-12-08 | Disposition: A | Discharge status: Home / Self Care | Payer: BC Managed Care – PPO | Source: Ambulatory Visit | Attending: General Surgery | Admitting: General Surgery

## 2020-12-08 ENCOUNTER — Ambulatory Visit (HOSPITAL_COMMUNITY)
Admission: RE | Admit: 2020-12-08 | Discharge: 2020-12-08 | Disposition: A | Discharge status: Home / Self Care | Payer: BC Managed Care – PPO | Source: Ambulatory Visit | Attending: General Surgery | Admitting: General Surgery

## 2020-12-08 ENCOUNTER — Encounter (HOSPITAL_COMMUNITY): Payer: Self-pay

## 2020-12-08 ENCOUNTER — Encounter (HOSPITAL_COMMUNITY)
Admission: RE | Admit: 2020-12-08 | Discharge: 2020-12-08 | Disposition: A | Discharge status: Home / Self Care | Payer: BC Managed Care – PPO | Source: Ambulatory Visit | Attending: General Surgery | Admitting: General Surgery

## 2020-12-08 DIAGNOSIS — K639 Disease of intestine, unspecified: Secondary | ICD-10-CM | POA: Diagnosis not present

## 2020-12-08 DIAGNOSIS — D125 Benign neoplasm of sigmoid colon: Secondary | ICD-10-CM | POA: Diagnosis not present

## 2020-12-08 DIAGNOSIS — Z01818 Encounter for other preprocedural examination: Secondary | ICD-10-CM | POA: Insufficient documentation

## 2020-12-08 DIAGNOSIS — Z841 Family history of disorders of kidney and ureter: Secondary | ICD-10-CM | POA: Diagnosis not present

## 2020-12-08 DIAGNOSIS — Z20822 Contact with and (suspected) exposure to covid-19: Secondary | ICD-10-CM | POA: Diagnosis not present

## 2020-12-08 DIAGNOSIS — K6389 Other specified diseases of intestine: Secondary | ICD-10-CM | POA: Diagnosis not present

## 2020-12-08 LAB — CBC WITH DIFFERENTIAL/PLATELET
Abs Immature Granulocytes: 0.01 10*3/uL (ref 0.00–0.07)
Basophils Absolute: 0 10*3/uL (ref 0.0–0.1)
Basophils Relative: 1 %
Eosinophils Absolute: 0.2 10*3/uL (ref 0.0–0.5)
Eosinophils Relative: 3 %
HCT: 39.9 % (ref 36.0–46.0)
Hemoglobin: 12.7 g/dL (ref 12.0–15.0)
Immature Granulocytes: 0 %
Lymphocytes Relative: 51 %
Lymphs Abs: 2.7 10*3/uL (ref 0.7–4.0)
MCH: 28.5 pg (ref 26.0–34.0)
MCHC: 31.8 g/dL (ref 30.0–36.0)
MCV: 89.7 fL (ref 80.0–100.0)
Monocytes Absolute: 0.4 10*3/uL (ref 0.1–1.0)
Monocytes Relative: 7 %
Neutro Abs: 2 10*3/uL (ref 1.7–7.7)
Neutrophils Relative %: 38 %
Platelets: 254 10*3/uL (ref 150–400)
RBC: 4.45 MIL/uL (ref 3.87–5.11)
RDW: 13.5 % (ref 11.5–15.5)
WBC: 5.2 10*3/uL (ref 4.0–10.5)
nRBC: 0 % (ref 0.0–0.2)

## 2020-12-08 LAB — COMPREHENSIVE METABOLIC PANEL
ALT: 17 U/L (ref 0–44)
AST: 21 U/L (ref 15–41)
Albumin: 4.2 g/dL (ref 3.5–5.0)
Alkaline Phosphatase: 73 U/L (ref 38–126)
Anion gap: 9 (ref 5–15)
BUN: 15 mg/dL (ref 6–20)
CO2: 25 mmol/L (ref 22–32)
Calcium: 9.3 mg/dL (ref 8.9–10.3)
Chloride: 105 mmol/L (ref 98–111)
Creatinine, Ser: 0.69 mg/dL (ref 0.44–1.00)
GFR, Estimated: >60 mL/min (ref >60)
Glucose, Bld: 90 mg/dL (ref 70–99)
Potassium: 3.7 mmol/L (ref 3.5–5.1)
Sodium: 139 mmol/L (ref 135–145)
Total Bilirubin: 1 mg/dL (ref 0.3–1.2)
Total Protein: 7.8 g/dL (ref 6.5–8.1)

## 2020-12-08 LAB — TYPE AND SCREEN
ABO/RH(D): O POS
Antibody Screen: NEGATIVE

## 2020-12-08 LAB — HCG, SERUM, QUALITATIVE: Preg, Serum: NEGATIVE

## 2020-12-09 LAB — CEA: CEA: 1.9 ng/mL (ref 0.0–4.7)

## 2020-12-09 LAB — SARS CORONAVIRUS 2 (TAT 6-24 HRS): SARS Coronavirus 2: NEGATIVE

## 2020-12-11 ENCOUNTER — Other Ambulatory Visit: Payer: Self-pay

## 2020-12-11 ENCOUNTER — Inpatient Hospital Stay (HOSPITAL_COMMUNITY)
Admission: RE | Admit: 2020-12-11 | Discharge: 2020-12-15 | DRG: 331 | Disposition: A | Discharge status: Home / Self Care | Payer: BC Managed Care – PPO | Attending: General Surgery | Admitting: General Surgery

## 2020-12-11 ENCOUNTER — Inpatient Hospital Stay (HOSPITAL_COMMUNITY): Payer: BC Managed Care – PPO | Admitting: Anesthesiology

## 2020-12-11 ENCOUNTER — Encounter (HOSPITAL_COMMUNITY)
Admission: RE | Disposition: A | Discharge status: Home / Self Care | Payer: Self-pay | Source: Home / Self Care | Attending: General Surgery

## 2020-12-11 ENCOUNTER — Encounter (HOSPITAL_COMMUNITY): Payer: Self-pay | Admitting: General Surgery

## 2020-12-11 DIAGNOSIS — Z9049 Acquired absence of other specified parts of digestive tract: Secondary | ICD-10-CM

## 2020-12-11 DIAGNOSIS — D125 Benign neoplasm of sigmoid colon: Secondary | ICD-10-CM | POA: Diagnosis present

## 2020-12-11 DIAGNOSIS — K6389 Other specified diseases of intestine: Secondary | ICD-10-CM | POA: Diagnosis not present

## 2020-12-11 DIAGNOSIS — Z20822 Contact with and (suspected) exposure to covid-19: Secondary | ICD-10-CM | POA: Diagnosis present

## 2020-12-11 DIAGNOSIS — K639 Disease of intestine, unspecified: Secondary | ICD-10-CM | POA: Diagnosis present

## 2020-12-11 DIAGNOSIS — Z841 Family history of disorders of kidney and ureter: Secondary | ICD-10-CM | POA: Diagnosis not present

## 2020-12-11 HISTORY — PX: BOWEL RESECTION: SHX1257

## 2020-12-11 HISTORY — DX: Other seasonal allergic rhinitis: J30.2

## 2020-12-11 HISTORY — DX: Acquired absence of other specified parts of digestive tract: Z90.49

## 2020-12-11 LAB — ABO/RH: ABO/RH(D): O POS

## 2020-12-11 SURGERY — RESECTION, RECTUM, LOW ANTERIOR
Anesthesia: General | Site: Abdomen

## 2020-12-11 MED ORDER — EPHEDRINE SULFATE 50 MG/ML IJ SOLN
INTRAMUSCULAR | Status: DC | PRN
  Administered 2020-12-11 (×2): 10 mg via INTRAVENOUS

## 2020-12-11 MED ORDER — MIDAZOLAM HCL 2 MG/2ML IJ SOLN
INTRAMUSCULAR | Status: AC
  Filled 2020-12-11: qty 2

## 2020-12-11 MED ORDER — ONDANSETRON HCL 4 MG/2ML IJ SOLN
INTRAMUSCULAR | Status: AC
  Filled 2020-12-11: qty 2

## 2020-12-11 MED ORDER — SIMETHICONE 80 MG PO CHEW: 40.0000 mg | CHEWABLE_TABLET | Freq: Four times a day (QID) | ORAL | Status: DC | PRN

## 2020-12-11 MED ORDER — KETOROLAC TROMETHAMINE 30 MG/ML IJ SOLN
INTRAMUSCULAR | Status: AC
  Filled 2020-12-11: qty 1

## 2020-12-11 MED ORDER — PROPOFOL 10 MG/ML IV BOLUS
INTRAVENOUS | Status: DC | PRN
  Administered 2020-12-11: 150 mg via INTRAVENOUS

## 2020-12-11 MED ORDER — MIDAZOLAM HCL 5 MG/5ML IJ SOLN
INTRAMUSCULAR | Status: DC | PRN
  Administered 2020-12-11: 2 mg via INTRAVENOUS

## 2020-12-11 MED ORDER — ROCURONIUM BROMIDE 10 MG/ML (PF) SYRINGE
PREFILLED_SYRINGE | INTRAVENOUS | Status: DC | PRN
  Administered 2020-12-11: 50 mg via INTRAVENOUS

## 2020-12-11 MED ORDER — CEFOTETAN DISODIUM 2 G IJ SOLR
INTRAMUSCULAR | Status: AC
  Filled 2020-12-11: qty 2

## 2020-12-11 MED ORDER — ALVIMOPAN 12 MG PO CAPS
12.0000 mg | ORAL_CAPSULE | ORAL | Status: AC
  Administered 2020-12-11: 12 mg via ORAL
  Filled 2020-12-11: qty 1

## 2020-12-11 MED ORDER — LACTATED RINGERS IV SOLN: INTRAVENOUS | Status: DC

## 2020-12-11 MED ORDER — DIPHENHYDRAMINE HCL 50 MG/ML IJ SOLN: 25.0000 mg | Freq: Four times a day (QID) | INTRAMUSCULAR | Status: DC | PRN

## 2020-12-11 MED ORDER — SUGAMMADEX SODIUM 200 MG/2ML IV SOLN
INTRAVENOUS | Status: DC | PRN
  Administered 2020-12-11: 200 mg via INTRAVENOUS

## 2020-12-11 MED ORDER — HYDROMORPHONE HCL 1 MG/ML IJ SOLN: 1.0000 mg | INTRAMUSCULAR | Status: DC | PRN

## 2020-12-11 MED ORDER — EPHEDRINE 5 MG/ML INJ
INTRAVENOUS | Status: AC
  Filled 2020-12-11: qty 10

## 2020-12-11 MED ORDER — ONDANSETRON HCL 4 MG/2ML IJ SOLN
INTRAMUSCULAR | Status: DC | PRN
Start: 2020-12-11 — End: 2020-12-11
  Administered 2020-12-11: 4 mg via INTRAVENOUS

## 2020-12-11 MED ORDER — DEXAMETHASONE SODIUM PHOSPHATE 10 MG/ML IJ SOLN
INTRAMUSCULAR | Status: AC
  Filled 2020-12-11: qty 1

## 2020-12-11 MED ORDER — HYDROCODONE-ACETAMINOPHEN 5-325 MG PO TABS
1.0000 | ORAL_TABLET | ORAL | Status: DC | PRN
  Administered 2020-12-11 – 2020-12-14 (×6): 1 via ORAL
  Filled 2020-12-11 (×6): qty 1

## 2020-12-11 MED ORDER — SODIUM CHLORIDE 0.9 % IV SOLN
2.0000 g | INTRAVENOUS | Status: AC
  Administered 2020-12-11: 2 g via INTRAVENOUS
  Filled 2020-12-11: qty 2

## 2020-12-11 MED ORDER — DEXAMETHASONE SODIUM PHOSPHATE 10 MG/ML IJ SOLN
INTRAMUSCULAR | Status: DC | PRN
  Administered 2020-12-11: 10 mg via INTRAVENOUS

## 2020-12-11 MED ORDER — ALVIMOPAN 12 MG PO CAPS
12.0000 mg | ORAL_CAPSULE | Freq: Two times a day (BID) | ORAL | Status: DC
  Administered 2020-12-12 (×2): 12 mg via ORAL
  Filled 2020-12-11 (×2): qty 1

## 2020-12-11 MED ORDER — ENOXAPARIN SODIUM 40 MG/0.4ML ~~LOC~~ SOLN
40.0000 mg | Freq: Once | SUBCUTANEOUS | Status: AC
  Administered 2020-12-11: 40 mg via SUBCUTANEOUS
  Filled 2020-12-11: qty 0.4

## 2020-12-11 MED ORDER — SODIUM CHLORIDE 0.9 % IV SOLN: INTRAVENOUS | Status: DC

## 2020-12-11 MED ORDER — CHLORHEXIDINE GLUCONATE CLOTH 2 % EX PADS
6.0000 | MEDICATED_PAD | Freq: Once | CUTANEOUS | Status: AC
  Administered 2020-12-11: 6 via TOPICAL

## 2020-12-11 MED ORDER — ACETAMINOPHEN 325 MG PO TABS: 650.0000 mg | ORAL_TABLET | Freq: Four times a day (QID) | ORAL | Status: DC | PRN

## 2020-12-11 MED ORDER — POVIDONE-IODINE 10 % EX OINT
TOPICAL_OINTMENT | CUTANEOUS | Status: AC
  Filled 2020-12-11: qty 1

## 2020-12-11 MED ORDER — BUPIVACAINE LIPOSOME 1.3 % IJ SUSP
INTRAMUSCULAR | Status: AC
  Filled 2020-12-11: qty 10

## 2020-12-11 MED ORDER — SODIUM CHLORIDE 0.9 % IR SOLN
Status: DC | PRN
  Administered 2020-12-11: 2000 mL

## 2020-12-11 MED ORDER — LACTATED RINGERS IV SOLN: INTRAVENOUS | Status: DC | PRN

## 2020-12-11 MED ORDER — ACETAMINOPHEN 650 MG RE SUPP: 650.0000 mg | Freq: Four times a day (QID) | RECTAL | Status: DC | PRN

## 2020-12-11 MED ORDER — LIDOCAINE HCL (PF) 2 % IJ SOLN
INTRAMUSCULAR | Status: AC
  Filled 2020-12-11: qty 5

## 2020-12-11 MED ORDER — LORAZEPAM 2 MG/ML IJ SOLN: 1.0000 mg | INTRAMUSCULAR | Status: DC | PRN

## 2020-12-11 MED ORDER — KETOROLAC TROMETHAMINE 30 MG/ML IJ SOLN: 30.0000 mg | Freq: Four times a day (QID) | INTRAMUSCULAR | Status: DC | PRN

## 2020-12-11 MED ORDER — LIDOCAINE 2% (20 MG/ML) 5 ML SYRINGE
INTRAMUSCULAR | Status: DC | PRN
  Administered 2020-12-11: 100 mg via INTRAVENOUS

## 2020-12-11 MED ORDER — ONDANSETRON HCL 4 MG/2ML IJ SOLN: 4.0000 mg | Freq: Four times a day (QID) | INTRAMUSCULAR | Status: DC | PRN

## 2020-12-11 MED ORDER — FENTANYL CITRATE (PF) 250 MCG/5ML IJ SOLN
INTRAMUSCULAR | Status: DC | PRN
  Administered 2020-12-11: 100 ug via INTRAVENOUS
  Administered 2020-12-11 (×2): 50 ug via INTRAVENOUS

## 2020-12-11 MED ORDER — FENTANYL CITRATE (PF) 250 MCG/5ML IJ SOLN
INTRAMUSCULAR | Status: AC
  Filled 2020-12-11: qty 5

## 2020-12-11 MED ORDER — ROCURONIUM BROMIDE 10 MG/ML (PF) SYRINGE
PREFILLED_SYRINGE | INTRAVENOUS | Status: AC
  Filled 2020-12-11: qty 10

## 2020-12-11 MED ORDER — PROMETHAZINE HCL 25 MG/ML IJ SOLN: 6.2500 mg | INTRAMUSCULAR | Status: DC | PRN

## 2020-12-11 MED ORDER — PROPOFOL 10 MG/ML IV BOLUS
INTRAVENOUS | Status: AC
  Filled 2020-12-11: qty 20

## 2020-12-11 MED ORDER — ONDANSETRON 4 MG PO TBDP: 4.0000 mg | ORAL_TABLET | Freq: Four times a day (QID) | ORAL | Status: DC | PRN

## 2020-12-11 MED ORDER — HYDROMORPHONE HCL 1 MG/ML IJ SOLN
0.2500 mg | INTRAMUSCULAR | Status: DC | PRN
  Administered 2020-12-11 (×2): 0.5 mg via INTRAVENOUS
  Filled 2020-12-11 (×2): qty 0.5

## 2020-12-11 MED ORDER — POVIDONE-IODINE 10 % OINT PACKET
TOPICAL_OINTMENT | CUTANEOUS | Status: DC | PRN
  Administered 2020-12-11: 1 via TOPICAL

## 2020-12-11 MED ORDER — DIPHENHYDRAMINE HCL 25 MG PO CAPS
25.0000 mg | ORAL_CAPSULE | Freq: Four times a day (QID) | ORAL | Status: DC | PRN
Start: 2020-12-11 — End: 2020-12-15

## 2020-12-11 MED ORDER — CHLORHEXIDINE GLUCONATE 0.12 % MT SOLN
15.0000 mL | Freq: Once | OROMUCOSAL | Status: AC
  Administered 2020-12-11: 15 mL via OROMUCOSAL
  Filled 2020-12-11: qty 15

## 2020-12-11 MED ORDER — ORAL CARE MOUTH RINSE: 15.0000 mL | Freq: Once | OROMUCOSAL | Status: AC

## 2020-12-11 MED ORDER — ENOXAPARIN SODIUM 40 MG/0.4ML ~~LOC~~ SOLN
40.0000 mg | SUBCUTANEOUS | Status: DC
  Administered 2020-12-12 – 2020-12-13 (×2): 40 mg via SUBCUTANEOUS
  Filled 2020-12-11 (×2): qty 0.4

## 2020-12-11 MED ORDER — KETOROLAC TROMETHAMINE 30 MG/ML IJ SOLN
30.0000 mg | Freq: Four times a day (QID) | INTRAMUSCULAR | Status: AC
  Administered 2020-12-11: 30 mg via INTRAVENOUS

## 2020-12-11 MED ORDER — BUPIVACAINE LIPOSOME 1.3 % IJ SUSP
INTRAMUSCULAR | Status: DC | PRN
  Administered 2020-12-11: 20 mL

## 2020-12-11 MED ORDER — MEPERIDINE HCL 50 MG/ML IJ SOLN: 6.2500 mg | INTRAMUSCULAR | Status: DC | PRN

## 2020-12-11 MED ORDER — CHLORHEXIDINE GLUCONATE CLOTH 2 % EX PADS: 6.0000 | MEDICATED_PAD | Freq: Once | CUTANEOUS | Status: DC

## 2020-12-11 SURGICAL SUPPLY — 49 items
APPLIER CLIP 13 LRG OPEN (CLIP)
APR CLP LRG 13 20 CLIP (CLIP)
BLADE SURG SZ10 CARB STEEL (BLADE) ×2 IMPLANT
CLIP APPLIE 13 LRG OPEN (CLIP) IMPLANT
COVER LIGHT HANDLE STERIS (MISCELLANEOUS) ×8 IMPLANT
COVER WAND RF STERILE (DRAPES) ×2 IMPLANT
DRSG OPSITE POSTOP 4X10 (GAUZE/BANDAGES/DRESSINGS) ×2 IMPLANT
ELECT REM PT RETURN 9FT ADLT (ELECTROSURGICAL) ×2
ELECTRODE REM PT RTRN 9FT ADLT (ELECTROSURGICAL) ×1 IMPLANT
GLOVE SURG SS PI 7.5 STRL IVOR (GLOVE) ×4 IMPLANT
GLOVE SURG UNDER POLY LF SZ7 (GLOVE) ×8 IMPLANT
GOWN STRL REUS W/TWL LRG LVL3 (GOWN DISPOSABLE) ×14 IMPLANT
INST SET MAJOR GENERAL (KITS) ×2 IMPLANT
KIT TURNOVER KIT A (KITS) ×2 IMPLANT
LIGASURE IMPACT 36 18CM CVD LR (INSTRUMENTS) ×2 IMPLANT
MANIFOLD NEPTUNE II (INSTRUMENTS) ×2 IMPLANT
NDL HYPO 18GX1.5 BLUNT FILL (NEEDLE) ×1 IMPLANT
NDL HYPO 21X1.5 SAFETY (NEEDLE) ×1 IMPLANT
NEEDLE HYPO 18GX1.5 BLUNT FILL (NEEDLE) ×2 IMPLANT
NEEDLE HYPO 21X1.5 SAFETY (NEEDLE) ×2 IMPLANT
NS IRRIG 1000ML POUR BTL (IV SOLUTION) ×4 IMPLANT
PACK COLON (CUSTOM PROCEDURE TRAY) ×2 IMPLANT
PAD ARMBOARD 7.5X6 YLW CONV (MISCELLANEOUS) ×2 IMPLANT
PENCIL SMOKE EVACUATOR COATED (MISCELLANEOUS) ×2 IMPLANT
RELOAD PROXIMATE 75MM BLUE (ENDOMECHANICALS) ×4 IMPLANT
RELOAD STAPLE 75 3.8 BLU REG (ENDOMECHANICALS) IMPLANT
RETRACTOR WND ALEXIS 25 LRG (MISCELLANEOUS) ×1 IMPLANT
RTRCTR WOUND ALEXIS 25CM LRG (MISCELLANEOUS) ×2
SHEET LAVH (DRAPES) ×2 IMPLANT
SPONGE INTESTINAL PEANUT (DISPOSABLE) IMPLANT
SPONGE LAP 18X18 RF (DISPOSABLE) ×5 IMPLANT
STAPLER CIRC CVD 29MM 37CM (STAPLE) IMPLANT
STAPLER CUT CVD 40MM GREEN (STAPLE) IMPLANT
STAPLER GUN LINEAR PROX 60 (STAPLE) ×1 IMPLANT
STAPLER PROXIMATE 75MM BLUE (STAPLE) ×1 IMPLANT
STAPLER VISISTAT (STAPLE) ×2 IMPLANT
SURGILUBE 2OZ TUBE FLIPTOP (MISCELLANEOUS) ×1 IMPLANT
SUT PDS AB 0 CTX 60 (SUTURE) ×4 IMPLANT
SUT PROLENE 3 0 PS 2 (SUTURE) IMPLANT
SUT SILK 0 FSL (SUTURE) ×2 IMPLANT
SUT SILK 2 0 (SUTURE)
SUT SILK 2-0 18XBRD TIE 12 (SUTURE) IMPLANT
SUT SILK 3 0 SH CR/8 (SUTURE) ×2 IMPLANT
SUT VIC AB 0 CT1 27 (SUTURE)
SUT VIC AB 0 CT1 27XBRD ANTBC (SUTURE) IMPLANT
SUT VICRYL AB 2 0 TIES (SUTURE) IMPLANT
SYR 20ML LL LF (SYRINGE) ×4 IMPLANT
TRAY FOLEY MTR SLVR 16FR STAT (SET/KITS/TRAYS/PACK) ×2 IMPLANT
YANKAUER SUCT BULB TIP 10FT TU (MISCELLANEOUS) ×4 IMPLANT

## 2020-12-11 NOTE — Interval H&P Note (Signed)
History and Physical Interval Note:  12/11/2020 7:15 AM  Jennifer Palmer  has presented today for surgery, with the diagnosis of Colon Mass.  The various methods of treatment have been discussed with the patient and family. After consideration of risks, benefits and other options for treatment, the patient has consented to  Procedure(s): LOW ANTERIOR BOWEL RESECTION (N/A) as a surgical intervention.  The patient's history has been reviewed, patient examined, no change in status, stable for surgery.  I have reviewed the patient's chart and labs.  Questions were answered to the patient's satisfaction.     Aviva Signs

## 2020-12-11 NOTE — Anesthesia Preprocedure Evaluation (Addendum)
Anesthesia Evaluation  Patient identified by MRN, date of birth, ID band Patient awake    Reviewed: Allergy & Precautions, NPO status , Patient's Chart, lab work & pertinent test results  History of Anesthesia Complications Negative for: history of anesthetic complications  Airway Mallampati: II  TM Distance: >3 FB Neck ROM: Full    Dental  (+) Caps, Dental Advisory Given, Chipped Right upper broken tooth:   Pulmonary neg pulmonary ROS,    Pulmonary exam normal breath sounds clear to auscultation       Cardiovascular Exercise Tolerance: Good Normal cardiovascular exam Rhythm:Regular Rate:Normal     Neuro/Psych PSYCHIATRIC DISORDERS Depression negative neurological ROS     GI/Hepatic negative GI ROS, Neg liver ROS, Sigmoid colon cancer   Endo/Other  negative endocrine ROS  Renal/GU negative Renal ROS  negative genitourinary   Musculoskeletal negative musculoskeletal ROS (+)   Abdominal   Peds negative pediatric ROS (+)  Hematology negative hematology ROS (+)   Anesthesia Other Findings   Reproductive/Obstetrics negative OB ROS                            Anesthesia Physical Anesthesia Plan  ASA: II  Anesthesia Plan: General   Post-op Pain Management:    Induction: Intravenous  PONV Risk Score and Plan: 4 or greater and Ondansetron, Dexamethasone and Midazolam  Airway Management Planned: Oral ETT  Additional Equipment:   Intra-op Plan:   Post-operative Plan: Extubation in OR  Informed Consent: I have reviewed the patients History and Physical, chart, labs and discussed the procedure including the risks, benefits and alternatives for the proposed anesthesia with the patient or authorized representative who has indicated his/her understanding and acceptance.     Dental advisory given  Plan Discussed with: CRNA and Surgeon  Anesthesia Plan Comments:          Anesthesia Quick Evaluation

## 2020-12-11 NOTE — Op Note (Signed)
Patient:  Jennifer Palmer  DOB:  11/26/1960  MRN:  [id number]   Preop Diagnosis: Sigmoid colon mass  Postop Diagnosis: Same  Procedure: Partial colectomy  Surgeon: Aviva Signs, MD  Assistant: Curlene Labrum, MD  Anes: General endotracheal  Indications: Patient is a 60 year old black female who was found on colonoscopy to have a large mass in the sigmoid colon.  Initial biopsies were negative for malignancy.  She now presents for a partial colectomy.  The risks and benefits of the procedure including bleeding, infection, cardiopulmonary difficulties, anastomotic leak, blood transfusion, and the possibility of malignancy were fully explained to the patient, who gave informed consent.  Procedure note: The patient was placed in low lithotomy position after induction of general endotracheal anesthesia.  The abdomen and perineum were prepped and draped using the usual sterile technique with ChloraPrep and Betadine.  Surgical site confirmation was performed.  A lower midline incision was made down to the fascia.  The peritoneal cavity was entered into without difficulty.  The liver was palpated and noted to be within normal limits.  The patient had undergone tattooing of the colon lesion endoscopically.  The palpable mass was noted in the mid sigmoid colon.  The sigmoid colon was noted to be tortuous and elongated.  The peritoneal reflection was incised proximally and distally.  We did identify the left ureter.  There were some lymph nodes that appeared to be slightly enlarged in the sigmoid colon mesentery.  A GIA-75 stapler was placed proximally and distally around the sigmoid colon.  The mesentery was then divided using the LigaSure.  A suture was placed proximally for orientation purposes.  The specimen was then removed from the operative field.  A side to side anastomosis was then performed using a GIA-75 stapler.  The colotomy was closed using a TA 60 stapler.  The staple line was bolstered  using 3-0 silk Lembert sutures.  A wide open anastomosis was palpated.  The bowel was returned into the abdominal cavity in an orderly fashion.  The abdominal cavity was copiously irrigated with normal saline.  All fluid was then removed from the operative field.  All operating personnel then changed her gown and gloves.  A new set up was used for closure.  The fascia was reapproximated using a looped 0 PDS running suture.  Subcutaneous layer was irrigated with normal saline.  Exparel was instilled into the surrounding wound.  The skin was closed using staples.  Betadine ointment and dry sterile dressing were applied.  All tape and needle counts were correct at the end of the procedure.  The patient was extubated in the operating room and transferred to PACU in stable condition.  Complications: None  EBL: 25 cc  Specimen: Sigmoid colon, suture proximal   Media Information         Document Information  Photos    12/11/2020 08:37  Attached To:  Hospital Encounter on 12/11/20   Source Information  Aviva Signs, MD  [facility]

## 2020-12-11 NOTE — Transfer of Care (Signed)
Immediate Anesthesia Transfer of Care Note  Patient: Jennifer Palmer  Procedure(s) Performed: PARTIAL COLECTOMY   (N/A Abdomen)  Patient Location: PACU  Anesthesia Type:General  Level of Consciousness: awake, alert , oriented and patient cooperative  Airway & Oxygen Therapy: Patient Spontanous Breathing and Patient connected to nasal cannula oxygen  Post-op Assessment: Report given to RN and Post -op Vital signs reviewed and stable  Post vital signs: Reviewed and stable  Last Vitals:  Vitals Value Taken Time  BP 136/71 12/11/20 0910  Temp    Pulse 82 12/11/20 0912  Resp 13 12/11/20 0912  SpO2 92 % 12/11/20 0912  Vitals shown include unvalidated device data.  Last Pain:  Vitals:   12/11/20 0640  TempSrc: Oral  PainSc: 0-No pain         Complications: No complications documented.

## 2020-12-11 NOTE — Anesthesia Postprocedure Evaluation (Signed)
Anesthesia Post Note  Patient: Jennifer Palmer  Procedure(s) Performed: PARTIAL COLECTOMY   (N/A Abdomen)  Patient location during evaluation: PACU Anesthesia Type: General Level of consciousness: awake and alert, oriented and patient cooperative Pain management: pain level controlled Vital Signs Assessment: post-procedure vital signs reviewed and stable Respiratory status: spontaneous breathing Cardiovascular status: blood pressure returned to baseline and stable Postop Assessment: no headache, no backache and no apparent nausea or vomiting Anesthetic complications: no   No complications documented.   Last Vitals:  Vitals:   12/11/20 0640  BP: (!) 143/78  Pulse: 66  Resp: 16  Temp: 36.7 C  SpO2: 96%    Last Pain:  Vitals:   12/11/20 0640  TempSrc: Oral  PainSc: 0-No pain                 Andilyn Bettcher

## 2020-12-12 ENCOUNTER — Encounter (HOSPITAL_COMMUNITY): Payer: Self-pay | Admitting: General Surgery

## 2020-12-12 LAB — MAGNESIUM: Magnesium: 2 mg/dL (ref 1.7–2.4)

## 2020-12-12 LAB — BASIC METABOLIC PANEL
Anion gap: 9 (ref 5–15)
BUN: 16 mg/dL (ref 6–20)
CO2: 24 mmol/L (ref 22–32)
Calcium: 7.7 mg/dL — ABNORMAL LOW (ref 8.9–10.3)
Chloride: 107 mmol/L (ref 98–111)
Creatinine, Ser: 0.65 mg/dL (ref 0.44–1.00)
GFR, Estimated: >60 mL/min (ref >60)
Glucose, Bld: 114 mg/dL — ABNORMAL HIGH (ref 70–99)
Potassium: 4.4 mmol/L (ref 3.5–5.1)
Sodium: 140 mmol/L (ref 135–145)

## 2020-12-12 LAB — CBC
HCT: 31.6 % — ABNORMAL LOW (ref 36.0–46.0)
Hemoglobin: 10 g/dL — ABNORMAL LOW (ref 12.0–15.0)
MCH: 29.1 pg (ref 26.0–34.0)
MCHC: 31.6 g/dL (ref 30.0–36.0)
MCV: 91.9 fL (ref 80.0–100.0)
Platelets: 219 10*3/uL (ref 150–400)
RBC: 3.44 MIL/uL — ABNORMAL LOW (ref 3.87–5.11)
RDW: 13.8 % (ref 11.5–15.5)
WBC: 8 10*3/uL (ref 4.0–10.5)
nRBC: 0 % (ref 0.0–0.2)

## 2020-12-12 LAB — PHOSPHORUS: Phosphorus: 3.2 mg/dL (ref 2.5–4.6)

## 2020-12-12 MED ORDER — CHLORHEXIDINE GLUCONATE CLOTH 2 % EX PADS
6.0000 | MEDICATED_PAD | Freq: Every day | CUTANEOUS | Status: DC
  Administered 2020-12-13 – 2020-12-14 (×2): 6 via TOPICAL

## 2020-12-12 NOTE — Progress Notes (Signed)
1 Day Post-Op  Subjective: Patient states her incisional pain is well controlled.  No bowel movement or flatus yet.  Objective: Vital signs in last 24 hours: Temp:  [97.8 F (36.6 C)-98.4 F (36.9 C)] 98.3 F (36.8 C) (03/08 0630) Pulse Rate:  [64-87] 66 (03/08 0630) Resp:  [11-22] 17 (03/08 0630) BP: (91-136)/(53-86) 111/64 (03/08 0630) SpO2:  [83 %-100 %] 96 % (03/08 0630) Weight:  [99.8 kg] 99.8 kg (03/07 1536) Last BM Date: 12/11/20  Intake/Output from previous day: 03/07 0701 - 03/08 0700 In: 3792.6 [P.O.:120; I.V.:3122.6; IV Piggyback:100] Out: 1325 [Urine:1300; Blood:25] Intake/Output this shift: No intake/output data recorded.  General appearance: alert, cooperative and no distress Resp: clear to auscultation bilaterally Cardio: regular rate and rhythm, S1, S2 normal, no murmur, click, rub or gallop GI: Soft, incision healing well with a small amount of blood present inferiorly.  No active bleeding noted.  No bowel sounds appreciated.  Lab Results:  Recent Labs    12/12/20 0504  WBC 8.0  HGB 10.0*  HCT 31.6*  PLT 219   BMET Recent Labs    12/12/20 0504  NA 140  K 4.4  CL 107  CO2 24  GLUCOSE 114*  BUN 16  CREATININE 0.65  CALCIUM 7.7*   PT/INR No results for input(s): LABPROT, INR in the last 72 hours.  Studies/Results: No results found.  Anti-infectives: Anti-infectives (From admission, onward)   Start     Dose/Rate Route Frequency Ordered Stop   12/11/20 0650  cefoTEtan (CEFOTAN) 2 g injection       Note to Pharmacy: Benay Pillow   : cabinet override      12/11/20 0650 12/11/20 1859   12/11/20 0645  cefoTEtan (CEFOTAN) 2 g in sodium chloride 0.9 % 100 mL IVPB        2 g 200 mL/hr over 30 Minutes Intravenous On call to O.R. 12/11/20 0630 12/11/20 0736      Assessment/Plan: s/p Procedure(s): PARTIAL COLECTOMY   Impression: Stable on postoperative day 1.  Will get patient out of bed.  Advance to full liquid diet.  Awaiting return of  bowel function.  Final pathology pending.  LOS: 1 day    Aviva Signs 12/12/2020

## 2020-12-12 NOTE — Progress Notes (Signed)
Per physician , the blood most likely caused by surgery and will clear up . Will continue to monitor

## 2020-12-12 NOTE — Progress Notes (Signed)
Patient complained of pain in her lower abdomen at start of shift but has stated since that she felt fine.

## 2020-12-12 NOTE — Progress Notes (Signed)
Foley catheter pulled this morning. Patient tolerated well. Blood noted between legs and there is blood noted at bottom of dressing. MD notified

## 2020-12-12 NOTE — Progress Notes (Signed)
Patient dressing reinforced , noted to have blood from the bottom of incision, also when patient voided a copious amount of blood present in urine, peri-care provided, gown changed. Patient voiced concerns , notified Dr. Arnoldo Morale, patient educated earlier the blood would clear up , however she is concerned about the amount .

## 2020-12-13 LAB — CBC
HCT: 28.5 % — ABNORMAL LOW (ref 36.0–46.0)
Hemoglobin: 8.9 g/dL — ABNORMAL LOW (ref 12.0–15.0)
MCH: 28.4 pg (ref 26.0–34.0)
MCHC: 31.2 g/dL (ref 30.0–36.0)
MCV: 91.1 fL (ref 80.0–100.0)
Platelets: 208 10*3/uL (ref 150–400)
RBC: 3.13 MIL/uL — ABNORMAL LOW (ref 3.87–5.11)
RDW: 13.9 % (ref 11.5–15.5)
WBC: 6.9 10*3/uL (ref 4.0–10.5)
nRBC: 0 % (ref 0.0–0.2)

## 2020-12-13 LAB — BASIC METABOLIC PANEL
Anion gap: 6 (ref 5–15)
BUN: 11 mg/dL (ref 6–20)
CO2: 23 mmol/L (ref 22–32)
Calcium: 8 mg/dL — ABNORMAL LOW (ref 8.9–10.3)
Chloride: 111 mmol/L (ref 98–111)
Creatinine, Ser: 0.57 mg/dL (ref 0.44–1.00)
GFR, Estimated: >60 mL/min (ref >60)
Glucose, Bld: 86 mg/dL (ref 70–99)
Potassium: 4 mmol/L (ref 3.5–5.1)
Sodium: 140 mmol/L (ref 135–145)

## 2020-12-13 LAB — PHOSPHORUS: Phosphorus: 1.7 mg/dL — ABNORMAL LOW (ref 2.5–4.6)

## 2020-12-13 LAB — MAGNESIUM: Magnesium: 2 mg/dL (ref 1.7–2.4)

## 2020-12-13 LAB — SURGICAL PATHOLOGY

## 2020-12-13 MED ORDER — POTASSIUM PHOSPHATES 15 MMOLE/5ML IV SOLN
20.0000 mmol | Freq: Once | INTRAVENOUS | Status: AC
  Administered 2020-12-13: 20 mmol via INTRAVENOUS
  Filled 2020-12-13: qty 6.67

## 2020-12-13 NOTE — Plan of Care (Signed)

## 2020-12-13 NOTE — Progress Notes (Signed)
Patient able to ambulate around nurses station without difficulty , states her abdomen feels " heavy " but the pain is tolerable. Then ambulated back to room without difficulty , call bell and hydration within reach.

## 2020-12-13 NOTE — Progress Notes (Signed)
2 Days Post-Op  Subjective: Patient has no complaints.  She thought she had a bowel movement which was bloody in nature this morning.  She states her urine seems to be clearing up blood.  She is tolerating full liquid diet well.  Objective: Vital signs in last 24 hours: Temp:  [98.2 F (36.8 C)-98.3 F (36.8 C)] 98.3 F (36.8 C) (03/09 0500) Pulse Rate:  [67-73] 73 (03/09 0500) Resp:  [18] 18 (03/09 0500) BP: (110-125)/(60-65) 114/60 (03/09 0500) SpO2:  [97 %-100 %] 98 % (03/09 0500) Last BM Date: 12/11/20  Intake/Output from previous day: 03/08 0701 - 03/09 0700 In: 1365.5 [P.O.:480; I.V.:885.5] Out: -  Intake/Output this shift: No intake/output data recorded.  General appearance: alert, cooperative and no distress Resp: clear to auscultation bilaterally Cardio: regular rate and rhythm, S1, S2 normal, no murmur, click, rub or gallop GI: Soft, incision healing well.  Bowel sounds active.  Lab Results:  Recent Labs    12/12/20 0504 12/13/20 0500  WBC 8.0 6.9  HGB 10.0* 8.9*  HCT 31.6* 28.5*  PLT 219 208   BMET Recent Labs    12/12/20 0504 12/13/20 0500  NA 140 140  K 4.4 4.0  CL 107 111  CO2 24 23  GLUCOSE 114* 86  BUN 16 11  CREATININE 0.65 0.57  CALCIUM 7.7* 8.0*   PT/INR No results for input(s): LABPROT, INR in the last 72 hours.  Studies/Results: No results found.  Anti-infectives: Anti-infectives (From admission, onward)   Start     Dose/Rate Route Frequency Ordered Stop   12/11/20 0650  cefoTEtan (CEFOTAN) 2 g injection       Note to Pharmacy: Benay Pillow   : cabinet override      12/11/20 0650 12/11/20 1859   12/11/20 0645  cefoTEtan (CEFOTAN) 2 g in sodium chloride 0.9 % 100 mL IVPB        2 g 200 mL/hr over 30 Minutes Intravenous On call to O.R. 12/11/20 0630 12/11/20 0736      Assessment/Plan: s/p Procedure(s): PARTIAL COLECTOMY   Impression: Stable on postoperative day 2.  Her bowel function is starting to return.  She did have  mild drop in hemoglobin, but this is to be expected secondary to surgery and hydration.  She does have hypophosphatemia which will be addressed.  Will advance her to a soft diet.  Will start ambulating her.  Anticipate discharge in next 24 to 48 hours.  Final pathology is pending.  LOS: 2 days    Aviva Signs 12/13/2020

## 2020-12-13 NOTE — Progress Notes (Signed)
  MEDICATION RELATED CONSULT NOTE - INITIAL   Pharmacy Consult for  IV phosphorus  Indication: hypophosphatemia  No Known Allergies  Patient Measurements: Height: 5\' 4"  (162.6 cm) Weight: 99.8 kg (220 lb) IBW/kg (Calculated) : 54.7   Vital Signs: Temp: 98.3 F (36.8 C) (03/09 0500) BP: 114/60 (03/09 0500) Pulse Rate: 73 (03/09 0500) Intake/Output from previous day: 03/08 0701 - 03/09 0700 In: 1365.5 [P.O.:480; I.V.:885.5] Out: -  Intake/Output from this shift: No intake/output data recorded.  Labs: Recent Labs    12/12/20 0504 12/13/20 0500  WBC 8.0 6.9  HGB 10.0* 8.9*  HCT 31.6* 28.5*  PLT 219 208  CREATININE 0.65 0.57  MG 2.0 2.0  PHOS 3.2 1.7*   Estimated Creatinine Clearance: 88 mL/min (by C-G formula based on SCr of 0.57 mg/dL).   Microbiology: Recent Results (from the past 720 hour(s))  SARS CORONAVIRUS 2 (TAT 6-24 HRS) Nasopharyngeal Nasopharyngeal Swab     Status: None   Collection Time: 12/08/20  3:20 PM   Specimen: Nasopharyngeal Swab  Result Value Ref Range Status   SARS Coronavirus 2 NEGATIVE NEGATIVE Final    Comment: (NOTE) SARS-CoV-2 target nucleic acids are NOT DETECTED.  The SARS-CoV-2 RNA is generally detectable in upper and lower respiratory specimens during the acute phase of infection. Negative results do not preclude SARS-CoV-2 infection, do not rule out co-infections with other pathogens, and should not be used as the sole basis for treatment or other patient management decisions. Negative results must be combined with clinical observations, patient history, and epidemiological information. The expected result is Negative.  Fact Sheet for Patients: SugarRoll.be  Fact Sheet for Healthcare Providers: https://www.woods-mathews.com/  This test is not yet approved or cleared by the [place] FDA and  has been authorized for detection and/or diagnosis of SARS-CoV-2 by FDA under an  Emergency Use Authorization (EUA). This EUA will remain  in effect (meaning this test can be used) for the duration of the COVID-19 declaration under Se ction 564(b)(1) of the Act, 21 U.S.C. section 360bbb-3(b)(1), unless the authorization is terminated or revoked sooner.  Performed at [facility], [place] [address], [place], [place] [address]     Medical History: Past Medical History:  Diagnosis Date  . Heel spur, left   . Seasonal allergies     Medications:  Medications Prior to Admission  Medication Sig Dispense Refill Last Dose  . loratadine (CLARITIN) 10 MG tablet Take 10 mg by mouth daily as needed for allergies.     . metroNIDAZOLE (FLAGYL) 500 MG tablet Take 1 tablet (500 mg total) by mouth 3 (three) times daily. Take one tablet by mouth at 1pm, 2pm, and 9pm the day before surgery 3 tablet 0   . neomycin (MYCIFRADIN) 500 MG tablet Take 2 tablets (1,000 mg total) by mouth 3 (three) times daily. Take 2 tablets by mouth at 1pm, 2pm, and 9pm the day before surgery 6 tablet 0   . Vitamin D, Ergocalciferol, (DRISDOL) 1.25 MG (50000 UNIT) CAPS capsule Take 1 capsule (50,000 Units total) by mouth every 7 (seven) days. (Patient not taking: Reported on 11/28/2020) 4 capsule 2 Not Taking at Unknown time      Plan:  Phos level 1.7 Potassium phosphate 20 mmol IV x 1 dose. F/U phos level in AM  Remo Lipps C Monigue Spraggins 12/13/2020,8:07 AM

## 2020-12-14 LAB — CBC
HCT: 33.9 % — ABNORMAL LOW (ref 36.0–46.0)
Hemoglobin: 10.4 g/dL — ABNORMAL LOW (ref 12.0–15.0)
MCH: 28.9 pg (ref 26.0–34.0)
MCHC: 30.7 g/dL (ref 30.0–36.0)
MCV: 94.2 fL (ref 80.0–100.0)
Platelets: 231 10*3/uL (ref 150–400)
RBC: 3.6 MIL/uL — ABNORMAL LOW (ref 3.87–5.11)
RDW: 13.8 % (ref 11.5–15.5)
WBC: 8.1 10*3/uL (ref 4.0–10.5)
nRBC: 0 % (ref 0.0–0.2)

## 2020-12-14 LAB — BASIC METABOLIC PANEL
Anion gap: 11 (ref 5–15)
BUN: 9 mg/dL (ref 6–20)
CO2: 22 mmol/L (ref 22–32)
Calcium: 8.8 mg/dL — ABNORMAL LOW (ref 8.9–10.3)
Chloride: 107 mmol/L (ref 98–111)
Creatinine, Ser: 0.59 mg/dL (ref 0.44–1.00)
GFR, Estimated: >60 mL/min (ref >60)
Glucose, Bld: 98 mg/dL (ref 70–99)
Potassium: 4 mmol/L (ref 3.5–5.1)
Sodium: 140 mmol/L (ref 135–145)

## 2020-12-14 LAB — MAGNESIUM: Magnesium: 2 mg/dL (ref 1.7–2.4)

## 2020-12-14 LAB — PHOSPHORUS: Phosphorus: 3.3 mg/dL (ref 2.5–4.6)

## 2020-12-14 NOTE — Progress Notes (Signed)
Patient has had two bowel movements this morning. The first one I did not personally see but the patient stated there was a lot of blood. I told her if she went again to not flush so I could take a look. Patient had second BM. I noted that there was quite a lot of blood present. Patient currently in no pain, no distress & no cramping. Will continue to monitor.

## 2020-12-14 NOTE — Progress Notes (Signed)
  MEDICATION RELATED CONSULT NOTE - INITIAL   Pharmacy Consult for  IV phosphorus  Indication: hypophosphatemia  No Known Allergies  Patient Measurements: Height: 5\' 4"  (162.6 cm) Weight: 99.8 kg (220 lb) IBW/kg (Calculated) : 54.7   Vital Signs: Temp: 98.2 F (36.8 C) (03/10 0527) Temp Source: Oral (03/10 0527) BP: 151/75 (03/10 0527) Pulse Rate: 68 (03/10 0527) Intake/Output from previous day: 03/09 0701 - 03/10 0700 In: 3095.1 [P.O.:960; I.V.:1683.6; IV Piggyback:451.5] Out: -  Intake/Output from this shift: No intake/output data recorded.  Labs: Recent Labs    12/12/20 0504 12/13/20 0500 12/14/20 0611  WBC 8.0 6.9 8.1  HGB 10.0* 8.9* 10.4*  HCT 31.6* 28.5* 33.9*  PLT 219 208 231  CREATININE 0.65 0.57 0.59  MG 2.0 2.0 2.0  PHOS 3.2 1.7* 3.3   Estimated Creatinine Clearance: 88 mL/min (by C-G formula based on SCr of 0.59 mg/dL).   Microbiology: Recent Results (from the past 720 hour(s))  SARS CORONAVIRUS 2 (TAT 6-24 HRS) Nasopharyngeal Nasopharyngeal Swab     Status: None   Collection Time: 12/08/20  3:20 PM   Specimen: Nasopharyngeal Swab  Result Value Ref Range Status   SARS Coronavirus 2 NEGATIVE NEGATIVE Final    Comment: (NOTE) SARS-CoV-2 target nucleic acids are NOT DETECTED.  The SARS-CoV-2 RNA is generally detectable in upper and lower respiratory specimens during the acute phase of infection. Negative results do not preclude SARS-CoV-2 infection, do not rule out co-infections with other pathogens, and should not be used as the sole basis for treatment or other patient management decisions. Negative results must be combined with clinical observations, patient history, and epidemiological information. The expected result is Negative.  Fact Sheet for Patients: SugarRoll.be  Fact Sheet for Healthcare Providers: https://www.woods-mathews.com/  This test is not yet approved or cleared by the [place] FDA and  has been authorized for detection and/or diagnosis of SARS-CoV-2 by FDA under an Emergency Use Authorization (EUA). This EUA will remain  in effect (meaning this test can be used) for the duration of the COVID-19 declaration under Se ction 564(b)(1) of the Act, 21 U.S.C. section 360bbb-3(b)(1), unless the authorization is terminated or revoked sooner.  Performed at [facility], [place] [address], [place], [place] [address]     Medical History: Past Medical History:  Diagnosis Date  . Heel spur, left   . Seasonal allergies     Medications:  Medications Prior to Admission  Medication Sig Dispense Refill Last Dose  . loratadine (CLARITIN) 10 MG tablet Take 10 mg by mouth daily as needed for allergies.     . metroNIDAZOLE (FLAGYL) 500 MG tablet Take 1 tablet (500 mg total) by mouth 3 (three) times daily. Take one tablet by mouth at 1pm, 2pm, and 9pm the day before surgery 3 tablet 0   . neomycin (MYCIFRADIN) 500 MG tablet Take 2 tablets (1,000 mg total) by mouth 3 (three) times daily. Take 2 tablets by mouth at 1pm, 2pm, and 9pm the day before surgery 6 tablet 0   . Vitamin D, Ergocalciferol, (DRISDOL) 1.25 MG (50000 UNIT) CAPS capsule Take 1 capsule (50,000 Units total) by mouth every 7 (seven) days. (Patient not taking: Reported on 11/28/2020) 4 capsule 2 Not Taking at Unknown time      Plan:  Phos level 3.3 F/U phos level 3/12  Garrett Coffee 12/14/2020,9:20 AM

## 2020-12-14 NOTE — Progress Notes (Signed)
3 Days Post-Op  Subjective: Patient concerned about her bloody bowel movements.  Otherwise she has no incisional pain.  Tolerating soft diet well.  Objective: Vital signs in last 24 hours: Temp:  [97.8 F (36.6 C)-98.4 F (36.9 C)] 98.2 F (36.8 C) (03/10 0527) Pulse Rate:  [68-79] 68 (03/10 0527) Resp:  [19-20] 20 (03/10 0527) BP: (121-151)/(65-75) 151/75 (03/10 0527) SpO2:  [95 %-100 %] 100 % (03/10 0527) Last BM Date: 12/12/20 (per patient)  Intake/Output from previous day: 03/09 0701 - 03/10 0700 In: 3095.1 [P.O.:960; I.V.:1683.6; IV Piggyback:451.5] Out: -  Intake/Output this shift: No intake/output data recorded.  General appearance: alert, cooperative and no distress Resp: clear to auscultation bilaterally Cardio: regular rate and rhythm, S1, S2 normal, no murmur, click, rub or gallop GI: Soft, incision healing well.  Bowel sounds active.  Lab Results:  Recent Labs    12/13/20 0500 12/14/20 0611  WBC 6.9 8.1  HGB 8.9* 10.4*  HCT 28.5* 33.9*  PLT 208 231   BMET Recent Labs    12/13/20 0500 12/14/20 0611  NA 140 140  K 4.0 4.0  CL 111 107  CO2 23 22  GLUCOSE 86 98  BUN 11 9  CREATININE 0.57 0.59  CALCIUM 8.0* 8.8*   PT/INR No results for input(s): LABPROT, INR in the last 72 hours.  Studies/Results: No results found.  Anti-infectives: Anti-infectives (From admission, onward)   Start     Dose/Rate Route Frequency Ordered Stop   12/11/20 0650  cefoTEtan (CEFOTAN) 2 g injection       Note to Pharmacy: Benay Pillow   : cabinet override      12/11/20 0650 12/11/20 1859   12/11/20 0645  cefoTEtan (CEFOTAN) 2 g in sodium chloride 0.9 % 100 mL IVPB        2 g 200 mL/hr over 30 Minutes Intravenous On call to O.R. 12/11/20 0630 12/11/20 0736      Assessment/Plan: s/p Procedure(s): PARTIAL COLECTOMY   Impression: Postoperative day 3.  Patient is having some bloody bowel movements, but her hemoglobin is actually improved since yesterday.  Her  hypophosphatemia has resolved.  I reassured her that this was normal.  Will stop Lovenox.  We will monitor for 24 more hours.  We will check H&H tomorrow in a.m.  Final pathology revealed a tubular adenoma with high-grade dysplasia but no malignancy seen.  Patient aware of diagnosis.  LOS: 3 days    Aviva Signs 12/14/2020

## 2020-12-15 LAB — HEMOGLOBIN AND HEMATOCRIT, BLOOD
HCT: 30.5 % — ABNORMAL LOW (ref 36.0–46.0)
Hemoglobin: 9.8 g/dL — ABNORMAL LOW (ref 12.0–15.0)

## 2020-12-15 MED ORDER — DOCUSATE SODIUM 100 MG PO CAPS: 100.0000 mg | ORAL_CAPSULE | Freq: Two times a day (BID) | ORAL | 2 refills | Status: AC | PRN

## 2020-12-15 MED ORDER — ONDANSETRON 4 MG PO TBDP: 4.0000 mg | ORAL_TABLET | Freq: Four times a day (QID) | ORAL | 0 refills | Status: DC | PRN

## 2020-12-15 MED ORDER — HYDROCODONE-ACETAMINOPHEN 5-325 MG PO TABS: 1.0000 | ORAL_TABLET | ORAL | 0 refills | Status: DC | PRN

## 2020-12-15 NOTE — Discharge Instructions (Signed)
Discharge Open Abdominal Surgery Instructions:  Common Complaints: Pain at the incision site is common. This will improve with time. Take your pain medications as described below. Some nausea is common and poor appetite. The main goal is to stay hydrated the first few days after surgery.   Diet/ Activity: Diet as tolerated. You have started and tolerated a diet in the hospital, and should continue to increase what you are able to eat.   You may not have a large appetite, but it is important to stay hydrated. Drink 64 ounces of water a day. Your appetite will return with time.  Keep a dry dressing in place over your staples daily or as needed. Some minor pink/ blood tinged drainage is expected. This will stop in a few days after surgery.  Shower per your regular routine daily.  Do not take hot showers. Take warm showers that are less than 10 minutes. Path the incision dry. Wear an abdominal binder daily with activity. You do not have to wear this while sleeping or sitting.  Rest and listen to your body, but do not remain in bed all day.  Walk everyday for at least 15-20 minutes. Deep cough and move around every 1-2 hours in the first few days after surgery.  Do not lift > 10 lbs, perform excessive bending, pushing, pulling, squatting for 6-8 weeks after surgery.  The activity restrictions and the abdominal binder are to prevent hernia formation at your incision while you are healing.  Do not place lotions or balms on your incision unless instructed to specifically by Dr. Constance Haw.   Pain Expectations and Narcotics: -After surgery you will have pain associated with your incisions and this is normal. The pain is muscular and nerve pain, and will get better with time. -You are encouraged and expected to take non narcotic medications like tylenol and ibuprofen (when able) to treat pain as multiple modalities can aid with pain treatment. -Narcotics are only used when pain is severe or there is  breakthrough pain. -You are not expected to have a pain score of 0 after surgery, as we cannot prevent pain. A pain score of 3-4 that allows you to be functional, move, walk, and tolerate some activity is the goal. The pain will continue to improve over the days after surgery and is dependent on your surgery. -Due to [place] law, we are only able to give a certain amount of pain medication to treat post operative pain, and we only give additional narcotics on a patient by patient basis.  -For most laparoscopic surgery, studies have shown that the majority of patients only need 10-15 narcotic pills, and for open surgeries most patients only need 15-20.   -Having appropriate expectations of pain and knowledge of pain management with non narcotics is important as we do not want anyone to become addicted to narcotic pain medication.  -Using ice packs in the first 48 hours and heating pads after 48 hours, wearing an abdominal binder (when recommended), and using over the counter medications are all ways to help with pain management.   -Simple acts like meditation and mindfulness practices after surgery can also help with pain control and research has proven the benefit of these practices.  Medication: Take tylenol and ibuprofen as needed for pain control, alternating every 4-6 hours.  Take Norco for breakthrough pain every 4 hours.  Take Colace for constipation related to narcotic pain medication. If you do not have a bowel movement in 2 days, take Miralax over  the counter.  Drink plenty of water to also prevent constipation.   Contact Information: If you have questions or concerns, please call our office, [phone], Monday- Thursday 8AM-5PM and Friday 8AM-12Noon.  If it is after hours or on the weekend, please call [facility], [phone], and ask to speak to the surgeon on call for Dr. Arnoldo Morale at [facility].    Open Total Colectomy, Care After This sheet gives you information about how to  care for yourself after your procedure. Your health care provider may also give you more specific instructions. If you have problems or questions, contact your health care provider. What can I expect after the procedure? After the procedure, it is common to have:  Pain in your abdomen, especially along your incision.  Tiredness. Your energy level will return to normal over the next several weeks.  Constipation.  Nausea.  Difficulty urinating. Follow these instructions at home: Medicines  Take over-the-counter and prescription medicines, including stool softeners, only as told by your health care provider.  Ask your health care provider if the medicine prescribed to you requires you to avoid driving or using machinery. Eating and drinking  Follow instructions from your health care provider about eating or drinking restrictions.  Eat a low-fiber diet for the first 4 weeks after surgery. ? Most people on a low-fiber eating plan should eat less than 10 grams (g) of fiber a day. Follow recommendations from your health care provider or dietitian about how much fiber you should have each day. ? Always check food labels to know the fiber content of packaged foods. In general, a low-fiber food will have fewer than 2 g of fiber a serving. ? In general, try to avoid whole grains, raw fruits and vegetables, dried fruit, tough cuts of meat, nuts, and seeds. Incision care  Follow instructions from your health care provider about how to take care of your incision. Make sure you: ? Wash your hands with soap and water for at least 20 seconds before and after you change your bandage (dressing). If soap and water are not available, use hand sanitizer. ? Change your dressing as told by your health care provider. ? Leave stitches (sutures), staples, or adhesive strips in place. These skin closures may need to stay in place for 2 weeks or longer. If adhesive strip edges start to loosen and curl up, you may  trim the loose edges. Do not remove adhesive strips completely unless your health care provider tells you to do that.  Avoid wearing tight clothing around your incision.  Protect your incision area from the sun.  Do not take baths, swim, or use a hot tub until your health care provider approves. You may shower.  Check your incision area every day for signs of infection. Check for: ? More redness, swelling, or pain. ? Fluid or blood. ? Warmth. ? Pus or a bad smell.   Managing constipation Your procedure may cause constipation. To prevent or treat constipation, you may need to:  Drink enough fluid to keep your urine pale yellow.  Take over-the-counter or prescription medicines. Activity  Rest as told by your health care provider.  Avoid sitting for a long time without moving. Get up to take short walks every 1-2 hours. This is important to improve blood flow and breathing. Ask for help if you feel weak or unsteady.  Avoid activities that require a lot of energy for 4-6 weeks after surgery, such as running, climbing, and lifting heavy objects.  You may need to continue to do breathing exercises.  Do not lift anything that is heavier than 10 lb (4.5 kg), or the limit that you are told, until your health care provider says that it is safe.  Do not drive until you are no longer taking prescription pain medicines and you feel you are able to drive safely. This might take up to 4 weeks.  Return to your normal activities as told by your health care provider. Ask your health care provider what activities are safe for you.   General instructions  If you have an opening (stoma) in your abdomen, follow instructions from your health care provider about how to care for the stoma and the external pouch attached to it (ostomy pouch).  Do not use any products that contain nicotine or tobacco, such as cigarettes, e-cigarettes, and chewing tobacco. These can delay incision healing after surgery. If  you need help quitting, ask your health care provider.  Keep all follow-up visits as told by your health care provider. This is important. Contact a health care provider if:  You have any of these signs of infection: ? More redness, swelling, or pain around your incision. ? Fluid or blood coming from your incision. ? Warmth coming from your incision. ? Pus or a bad smell coming from your incision. ? A fever or chills.  You do not have a bowel movement within 2-3 days after surgery.  You cannot eat or drink for 24 hours or longer.  You have nausea and vomiting that will not go away.  You have pain in your abdomen that gets worse and does not get better with medicine. Get help right away if:  You have chest pain.  You have shortness of breath.  You have pain or swelling in your legs.  Your incision breaks open after your sutures, staples, or adhesive strips have been removed.  You have bleeding from the rectum. These symptoms may represent a serious problem that is an emergency. Do not wait to see if the symptoms will go away. Get medical help right away. Call your local emergency services (911 in the [place] Do not drive yourself to the hospital. Summary  After your procedure, it is common to have pain, tiredness, constipation, nausea, and difficulty urinating.  If you have an opening (stoma) in your abdomen, follow instructions from your health care provider about how to care for the stoma and the external pouch attached to it (ostomy pouch).  Follow instructions from your health care provider about eating and drinking and about how to take care of your incision.  Keep all follow-up visits as told by your health care provider. This is important. This information is not intended to replace advice given to you by your health care provider. Make sure you discuss any questions you have with your health care provider. Document Revised: 09/01/2019 Document Reviewed:  09/01/2019 Elsevier Patient Education  2021 [employer].

## 2020-12-15 NOTE — Discharge Summary (Signed)
Physician Discharge Summary  Patient ID: Jennifer Palmer MRN: [id number] DOB/AGE: November 28, 1960 60 y.o.  Admit date: 12/11/2020 Discharge date: 12/15/2020  Admission Diagnoses: Colon mass   Discharge Diagnoses:  Active Problems:   S/P partial colectomy   Colonic mass   Pathology: FINAL MICROSCOPIC DIAGNOSIS:   A. COLON, SIGMOID, RESECTION:  - Tubulovillous adenoma with multifocal high-grade dysplasia, 3.6 cm  - No definite evidence of invasion  - Resection margins are unremarkable  - Seven benign lymph nodes (0/7)   GROSS DESCRIPTION:   Specimen: Sigmoid colon resection, suture at proximal margin, received  fresh.  Specimen integrity: Received opened  Specimen length: 13 cm  Mesorectal intactness: Not applicable  Tumor location: Mesenteric wall  Tumor size: 3.6 x 3.2 cm tan-red fungating mass with a base length of 2  cm.  Percent of bowel circumference involved: The mass fills 70% of the  lumen.  Tumor distance to margins:            Proximal: 3 cm            Distal: 8 cm            Mesenteric (sigmoid and transverse): 5 cm  Macroscopic extent of tumor invasion: The muscularis beneath the mass is  focally ill-defined, however there is no gross distinct transection of  the muscularis.  Total presumed lymph nodes: Found on initial search are 7 possible lymph  nodes ranging from 0.2 to 0.8 cm.  The fat is placed in clearing solution overnight for examination of  additional lymph nodes.  Extramural satellite tumor nodules: None  Mucosal polyp(s): None  Additional findings: None  Block summary:  Block 1 = proximal margin  Block 2 = distal margin  Blocks 3-8 = mass, including entire base  Block 9 = 4 possible nodes found on initial search  Block 10 = 3 possible nodes found on initial search   Discharged Condition: Good  Hospital Course:  Ms .Jennifer Palmer is a 60 yo who has a colonic mass and came in for a colectomy. She did well after her  colectomy and was eating a diet, having good pain control and ambulating. She did have some bloody BMs but her H&H stabilized out. Her last BM was not bloody.   Consults: None  Significant Diagnostic Studies: Results for LIZANIA, BOUCHARD (MRN [id number]) as of 12/15/2020 13:08  Ref. Range 12/12/2020 05:04 12/13/2020 05:00 12/14/2020 06:11 12/15/2020 05:32  Hemoglobin Latest Ref Range: 12.0 - 15.0 g/dL 10.0 (L) 8.9 (L) 10.4 (L) 9.8 (L)  HCT Latest Ref Range: 36.0 - 46.0 % 31.6 (L) 28.5 (L) 33.9 (L) 30.5 (L)   Treatments:  Partial colectomy   Discharge Exam: Blood pressure (!) 143/77, pulse 70, temperature (!) 97.1 F (36.2 C), resp. rate 16, height 5\' 4"  (1.626 m), weight 99.8 kg, SpO2 98 %. General appearance: alert, cooperative and no distress Resp: normal of breathing GI: soft, nondistended, staples c/d/i without erythema or drainage  Disposition: Discharge disposition: 01-Home or Self Care       Discharge Instructions    Call MD for:  difficulty breathing, headache or visual disturbances   Complete by: As directed    Call MD for:  extreme fatigue   Complete by: As directed    Call MD for:  persistant dizziness or light-headedness   Complete by: As directed    Call MD for:  persistant nausea and vomiting   Complete by: As directed    Call MD for:  redness, tenderness,  or signs of infection (pain, swelling, redness, odor or green/yellow discharge around incision site)   Complete by: As directed    Call MD for:  severe uncontrolled pain   Complete by: As directed    Call MD for:  temperature >100.4   Complete by: As directed    Increase activity slowly   Complete by: As directed      Allergies as of 12/15/2020   No Known Allergies     Medication List    STOP taking these medications   metroNIDAZOLE 500 MG tablet Commonly known as: Flagyl   neomycin 500 MG tablet Commonly known as: MYCIFRADIN     TAKE these medications   docusate sodium 100 MG capsule Commonly known  as: Colace Take 1 capsule (100 mg total) by mouth 2 (two) times daily as needed for mild constipation.   HYDROcodone-acetaminophen 5-325 MG tablet Commonly known as: NORCO/VICODIN Take 1 tablet by mouth every 4 (four) hours as needed for severe pain.   loratadine 10 MG tablet Commonly known as: CLARITIN Take 10 mg by mouth daily as needed for allergies.   ondansetron 4 MG disintegrating tablet Commonly known as: ZOFRAN-ODT Take 1 tablet (4 mg total) by mouth every 6 (six) hours as needed for nausea.   Vitamin D (Ergocalciferol) 1.25 MG (50000 UNIT) Caps capsule Commonly known as: DRISDOL Take 1 capsule (50,000 Units total) by mouth every 7 (seven) days.       Follow-up Information    Aviva Signs, MD Follow up on 12/26/2020.   Specialty: General Surgery Why: post op with staple removal  Contact information: [address] [address] [phone]               Signed: Virl Cagey 12/15/2020, 1:12 PM

## 2020-12-18 ENCOUNTER — Telehealth: Payer: Self-pay

## 2020-12-18 NOTE — Telephone Encounter (Signed)
Transition Care Management Unsuccessful Follow-up Telephone Call  Date of discharge and from where:  12/15/2020 from [facility]  Attempts:  1st Attempt  Reason for unsuccessful TCM follow-up call:  Left voice message

## 2020-12-19 NOTE — Telephone Encounter (Signed)
Transition Care Management Follow-up Telephone Call  Date of discharge and from where: 12/15/2020 from [facility]  How have you been since you were released from the hospital? Pt stats that she is feeling well.   Any questions or concerns? Yes Pt had a question about diet. Pt stated that she was eating solid foods before she left the hospital. I reviewed the Post Op instructions in the AVS and informed that diet is as tolerated and to let the surgeon know if she is having any issues with foods. Pt also asked about dressing the staples and about cleaning the op location. Pt informed per the AVS Post Op instrucions to clean with warm water and pat area dry. Pt stated that she did not have a dressing over the surgical site when she left the hospital. I asked pt if there was any drainage and pt stated that there was no drainage from the site.   Items Reviewed:  Did the pt receive and understand the discharge instructions provided? Yes   Medications obtained and verified? Yes   Other? No   Any new allergies since your discharge? No   Dietary orders reviewed? Diet as tolerated  Do you have support at home? Yes   Functional Questionnaire: (I = Independent and D = Dependent) ADLs: I  Bathing/Dressing- I  Meal Prep- I  Eating- I  Maintaining continence- I  Transferring/Ambulation- I  Managing Meds- I   Follow up appointments reviewed:   [facility] f/u appt confirmed? Yes  Scheduled to see Aviva Signs, MD on 12/26/2020 @ 1:15pm.  Are transportation arrangements needed? No   If their condition worsens, is the pt aware to call PCP or go to the Emergency Dept.? Yes  Was the patient provided with contact information for the PCP's office or ED? Yes  Was to pt encouraged to call back with questions or concerns? Yes

## 2020-12-20 ENCOUNTER — Telehealth: Payer: Self-pay | Admitting: Family Medicine

## 2020-12-20 NOTE — Telephone Encounter (Signed)
[employer] paperwork [facility]) filled out and faxed to [phone] with confirmation received.

## 2020-12-26 ENCOUNTER — Encounter: Payer: Self-pay | Admitting: General Surgery

## 2020-12-26 ENCOUNTER — Other Ambulatory Visit: Payer: Self-pay

## 2020-12-26 ENCOUNTER — Ambulatory Visit (INDEPENDENT_AMBULATORY_CARE_PROVIDER_SITE_OTHER): Payer: BC Managed Care – PPO | Admitting: General Surgery

## 2020-12-26 VITALS — BP 136/67 | HR 70 | Temp 98.4°F | Resp 16 | Ht 64.0 in | Wt 222.0 lb

## 2020-12-26 DIAGNOSIS — Z09 Encounter for follow-up examination after completed treatment for conditions other than malignant neoplasm: Secondary | ICD-10-CM

## 2020-12-26 NOTE — Progress Notes (Signed)
Subjective:     Princess Bruins  Here for follow-up visit, status post partial colectomy.  Patient doing very well.  She denies any fever or chills.  She is having normal bowel movements.  Her appetite has returned. Objective:    BP 136/67   Pulse 70   Temp 98.4 F (36.9 C) (Other (Comment))   Resp 16   Ht 5\' 4"  (1.626 m)   Wt 222 lb (100.7 kg)   LMP  (LMP Unknown)   SpO2 93%   BMI 38.11 kg/m   General:  alert, cooperative and no distress  Abdomen is soft, incision healing well.  Staples removed, Steri-Strips applied. Final pathology shows no malignancy, only multifocal high-grade dysplasia present.  None of the 7 lymph nodes removed are involved.  Resection margins clear.  Patient aware.     Assessment:    Doing well postoperatively.    Plan:   May return to work without restrictions on 01/22/2021.  Follow-up here as needed.

## 2021-01-03 ENCOUNTER — Other Ambulatory Visit: Payer: Self-pay | Admitting: Family Medicine

## 2021-01-03 DIAGNOSIS — Z9049 Acquired absence of other specified parts of digestive tract: Secondary | ICD-10-CM

## 2021-01-11 IMAGING — MG DIGITAL SCREENING BILAT W/ TOMO W/ CAD
6 of 12 series · 6 of 36 positions shown · non-contrast
Comparison: Previous exam(s).

CLINICAL DATA: Screening.

EXAM:
DIGITAL SCREENING BILATERAL MAMMOGRAM WITH TOMO AND CAD

[L MLO synth-2D]
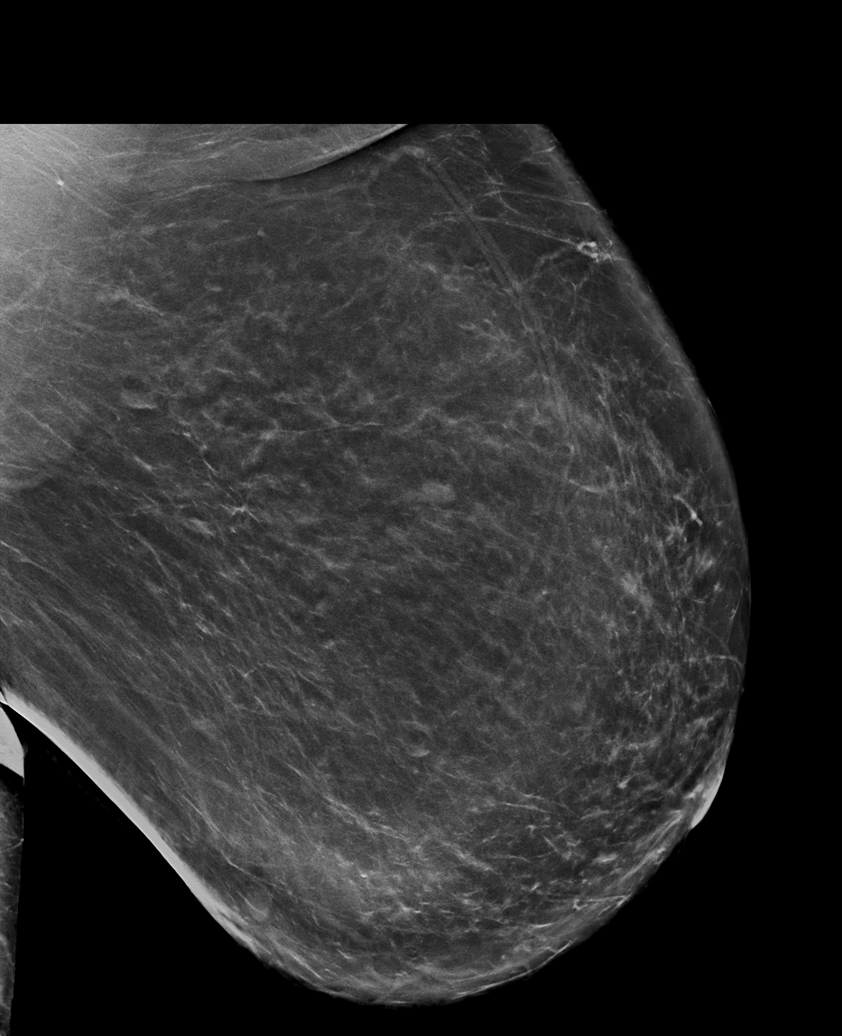

[L CV synth-2D]
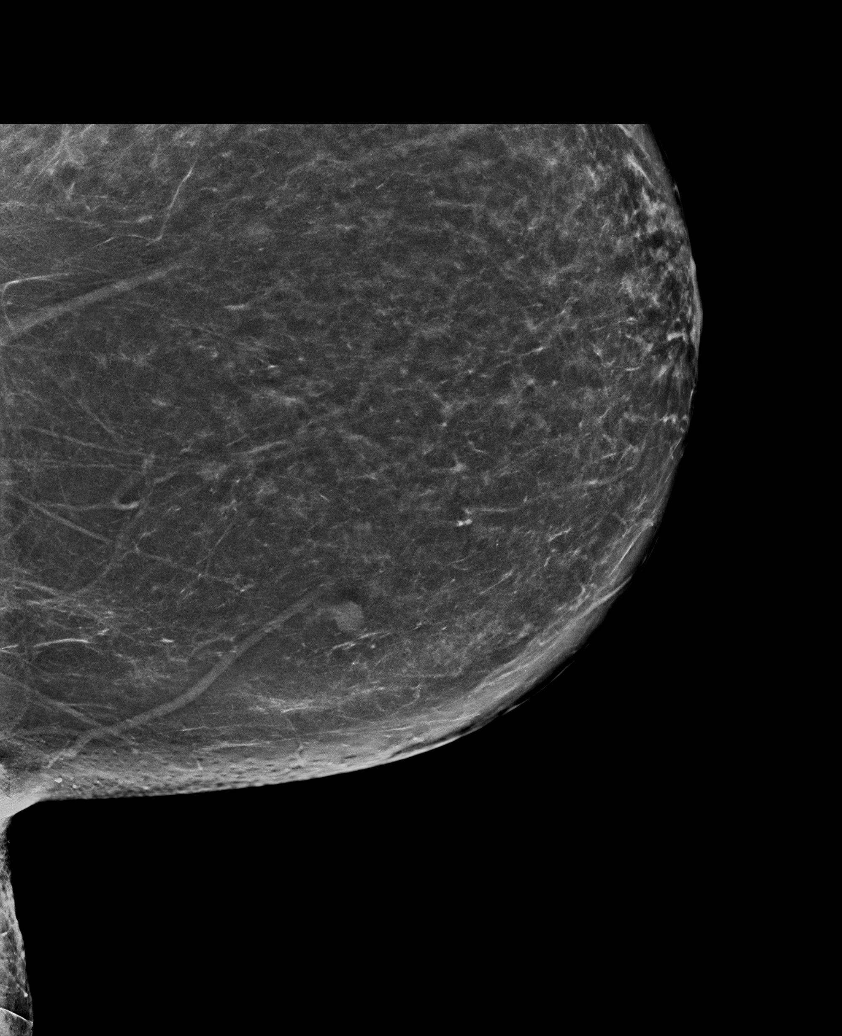

[L CC synth-2D]
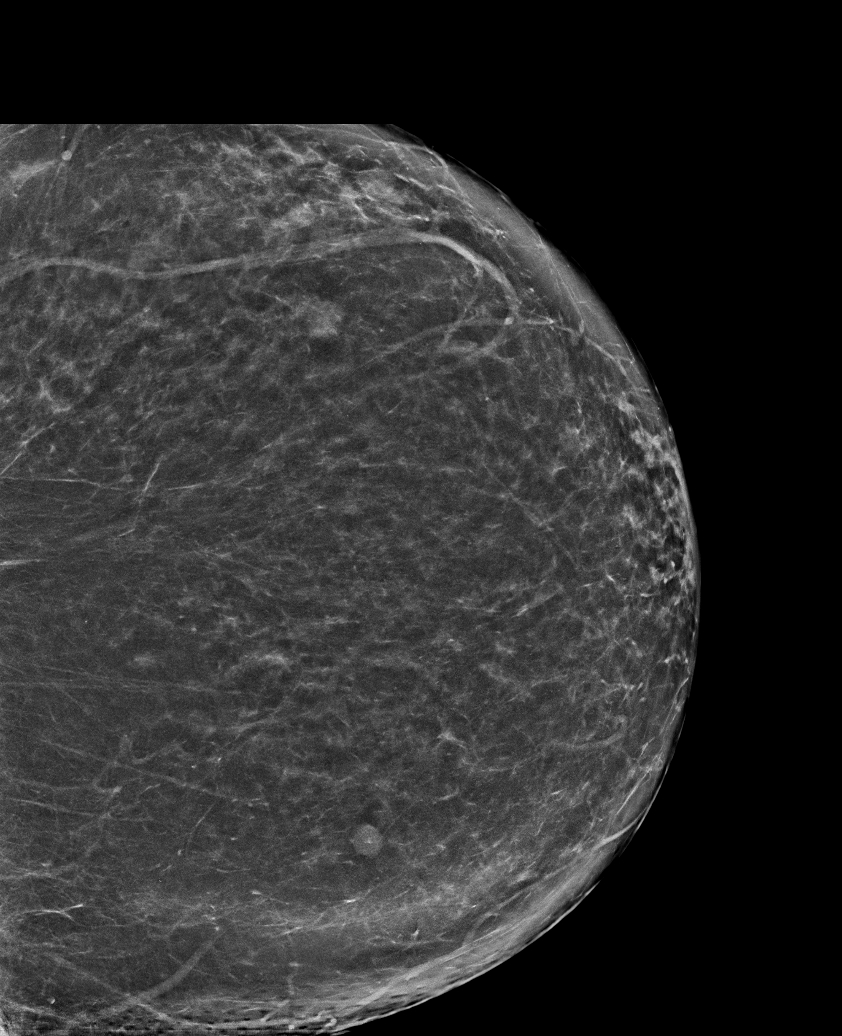

[R MLO synth-2D (1 of 2)]
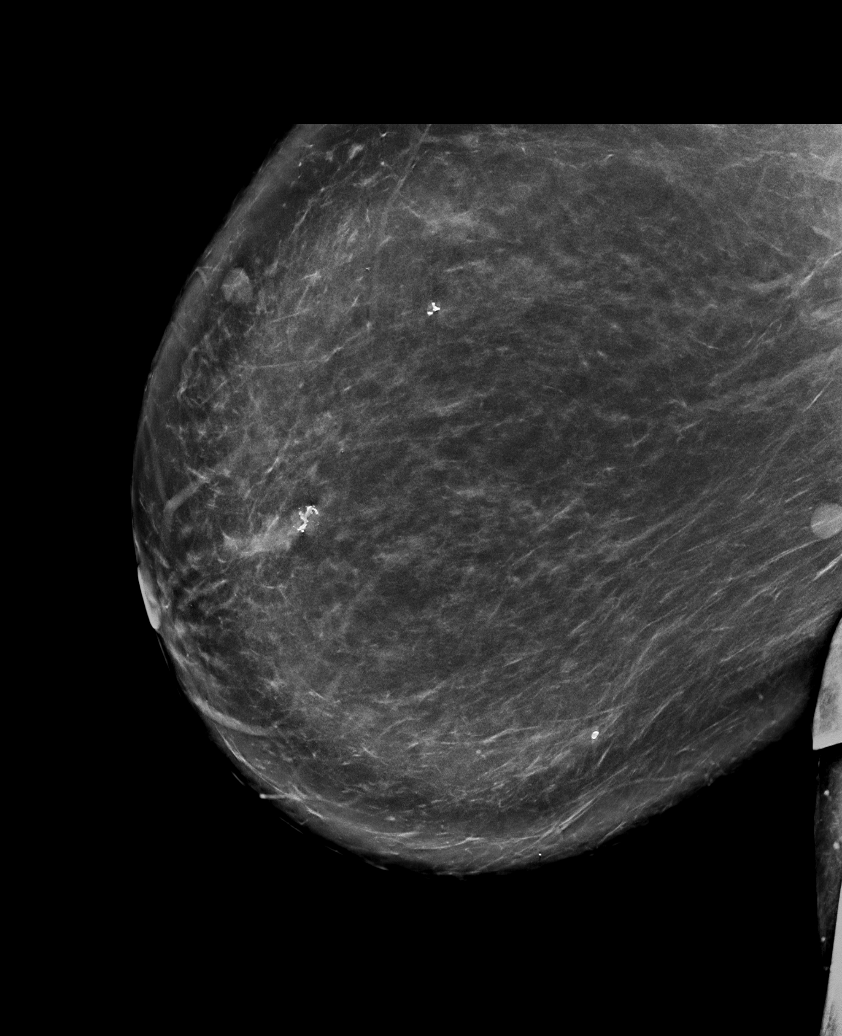

[R MLO synth-2D (2 of 2)]
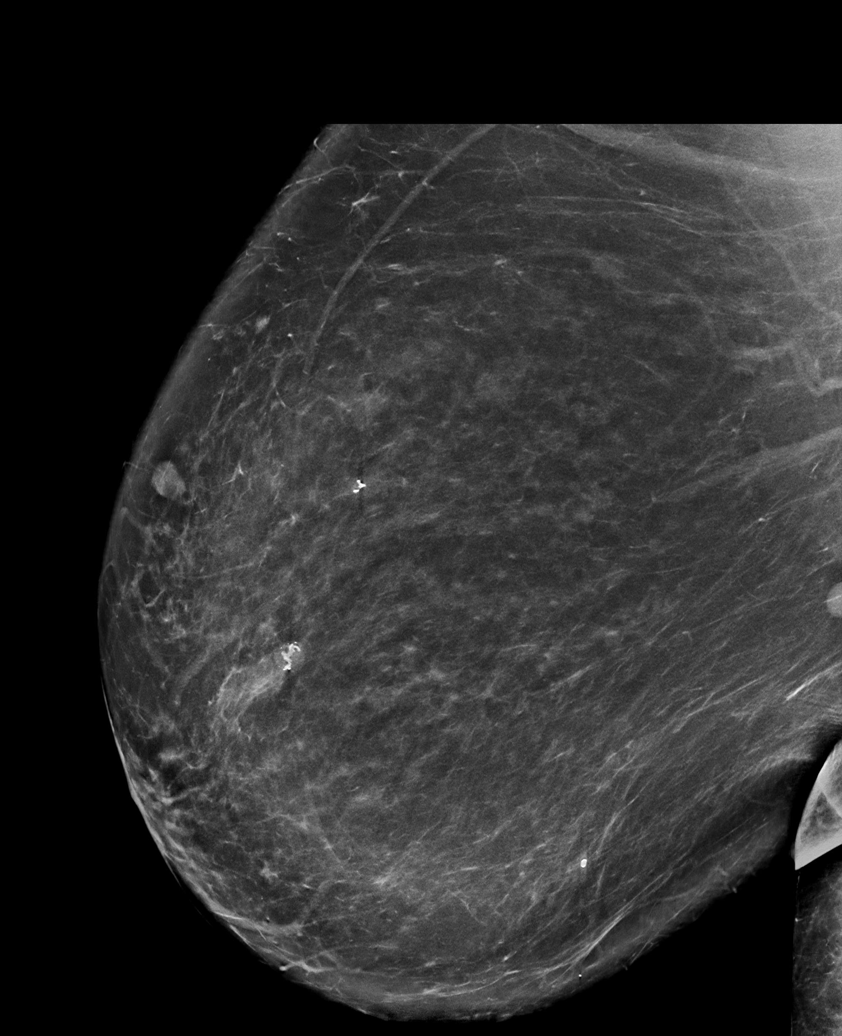

[R CC synth-2D]
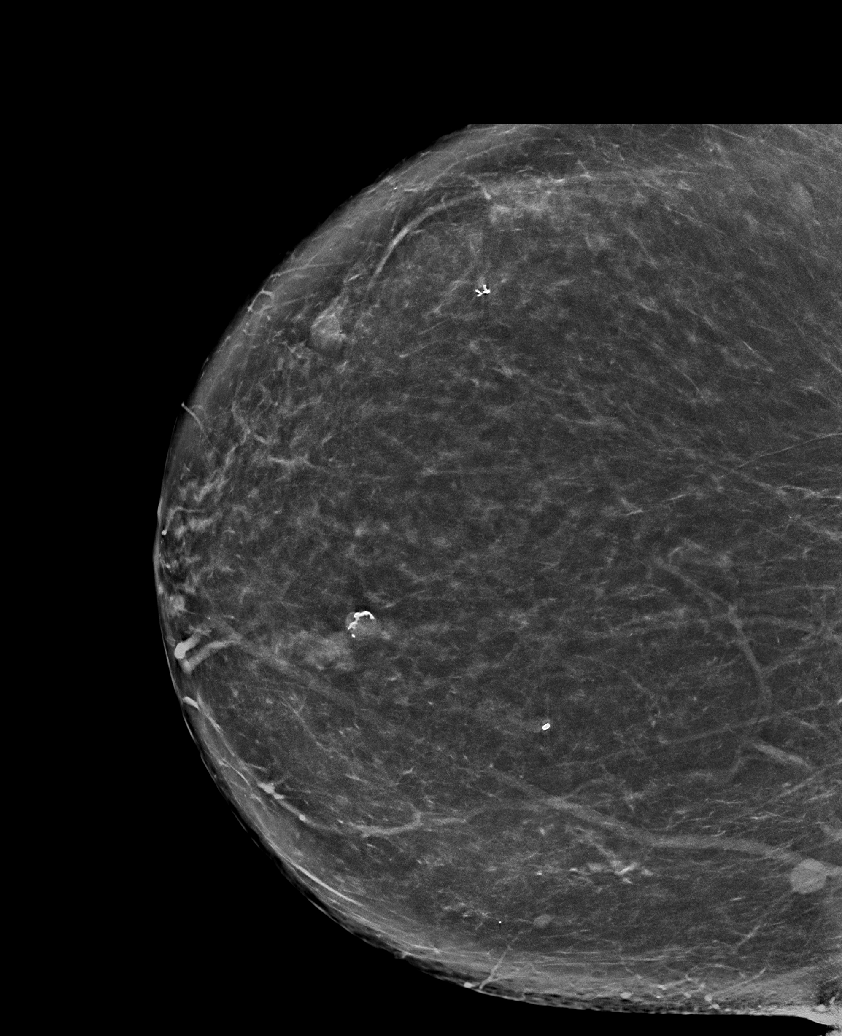

[6 of 36 positions shown; findings below may reference images not displayed]

ACR Breast Density Category b: There are scattered areas of
fibroglandular density.
FINDINGS: There are no findings suspicious for malignancy. Images were
processed with CAD.
IMPRESSION: No mammographic evidence of malignancy. A result letter of this
screening mammogram will be mailed directly to the patient.

RECOMMENDATION:
Screening mammogram in one year. (Code:[id number])

BI-RADS CATEGORY  1: Negative.

## 2021-06-13 ENCOUNTER — Telehealth: Payer: Self-pay | Admitting: Family Medicine

## 2021-06-13 NOTE — Telephone Encounter (Signed)
Patient dropped off employee wellness form for provider to complete. Patient not due for another cpe until November; patient stated it's fine to use info from previous CPE. Form placed in provider's folder at nurse's station. Please advise at [phone] when form ready for pickup.

## 2021-06-25 NOTE — Telephone Encounter (Signed)
Nilda Simmer, NP    Form completed and placed in nurses box on Friday. Thanks

## 2021-06-25 NOTE — Telephone Encounter (Signed)
Per Medical Records : Form up front for pick up and patient aware.

## 2022-07-03 ENCOUNTER — Telehealth: Payer: Self-pay

## 2022-07-03 NOTE — Telephone Encounter (Signed)
Error

## 2024-03-15 ENCOUNTER — Ambulatory Visit (INDEPENDENT_AMBULATORY_CARE_PROVIDER_SITE_OTHER): Admitting: Physician Assistant

## 2024-03-15 ENCOUNTER — Encounter: Payer: Self-pay | Admitting: Physician Assistant

## 2024-03-15 VITALS — BP 132/80 | HR 64 | Temp 97.7°F | Ht 64.0 in | Wt 230.0 lb

## 2024-03-15 DIAGNOSIS — Z0000 Encounter for general adult medical examination without abnormal findings: Secondary | ICD-10-CM | POA: Diagnosis not present

## 2024-03-15 DIAGNOSIS — Z1151 Encounter for screening for human papillomavirus (HPV): Secondary | ICD-10-CM

## 2024-03-15 DIAGNOSIS — Z1231 Encounter for screening mammogram for malignant neoplasm of breast: Secondary | ICD-10-CM

## 2024-03-15 DIAGNOSIS — Z13220 Encounter for screening for lipoid disorders: Secondary | ICD-10-CM | POA: Diagnosis not present

## 2024-03-15 DIAGNOSIS — E559 Vitamin D deficiency, unspecified: Secondary | ICD-10-CM

## 2024-03-15 DIAGNOSIS — Z124 Encounter for screening for malignant neoplasm of cervix: Secondary | ICD-10-CM

## 2024-03-15 DIAGNOSIS — Z0001 Encounter for general adult medical examination with abnormal findings: Secondary | ICD-10-CM | POA: Diagnosis not present

## 2024-03-15 DIAGNOSIS — Z7689 Persons encountering health services in other specified circumstances: Secondary | ICD-10-CM

## 2024-03-15 NOTE — Progress Notes (Signed)
 Complete physical exam  Patient: Jennifer Palmer   DOB: [birth year]   63 y.o. Female  MRN: [id number]  Subjective:     No chief complaint on file.   Jennifer Palmer is a 63 y.o. female who presents today for a complete physical exam. She reports consuming a general diet. She does not participate in regular exercise secondary to her work schedule and foot pain/heel spurs. She generally feels fairly well. She reports sleeping decent, however work schedule significant impacts sleep. She does not have additional problems to discuss today.    Most recent fall risk assessment:    11/21/2020    1:55 PM  Fall Risk   Falls in the past year? 0  Follow up Falls evaluation completed     Most recent depression screenings:    03/15/2024    9:24 AM 08/04/2020   10:05 AM  PHQ 2/9 Scores  PHQ - 2 Score 0 4  PHQ- 9 Score 3 17    Vision:Within last year and Dental: No current dental problems and Last dental visit: was more than 1 year ago  Patient Care Team: Marrie Chandra, Jearlean Mince, [place] as PCP - General (Physician Assistant) Vinetta Greening, DO as Consulting Physician (Internal Medicine)   Outpatient Medications Prior to Visit  Medication Sig   [DISCONTINUED] HYDROcodone -acetaminophen  (NORCO/VICODIN) 5-325 MG tablet Take 1 tablet by mouth every 4 (four) hours as needed for severe pain.   [DISCONTINUED] loratadine (CLARITIN) 10 MG tablet Take 10 mg by mouth daily as needed for allergies.   [DISCONTINUED] ondansetron  (ZOFRAN -ODT) 4 MG disintegrating tablet Take 1 tablet (4 mg total) by mouth every 6 (six) hours as needed for nausea.   [DISCONTINUED] Vitamin D , Ergocalciferol , (DRISDOL ) 1.25 MG (50000 UNIT) CAPS capsule Take 1 capsule (50,000 Units total) by mouth every 7 (seven) days.   No facility-administered medications prior to visit.    Review of Systems  Constitutional:  Negative for chills, fever and malaise/fatigue.  Eyes:  Negative for blurred vision and double vision.  Respiratory:   Negative for cough and shortness of breath.   Cardiovascular:  Negative for chest pain and palpitations.  Musculoskeletal:  Negative for joint pain and myalgias.  Neurological:  Negative for dizziness and headaches.  Psychiatric/Behavioral:  Negative for depression. The patient is not nervous/anxious.           Objective:     BP 132/80   Pulse 64   Temp 97.7 F (36.5 C)   Ht 5\' 4"  (1.626 m)   Wt 230 lb (104.3 kg)   LMP  (LMP Unknown)   SpO2 96%   BMI 39.48 kg/m    Physical Exam Constitutional:      Appearance: Normal appearance. She is obese.  HENT:     Head: Normocephalic.     Right Ear: Tympanic membrane normal.     Left Ear: Tympanic membrane normal.     Mouth/Throat:     Mouth: Mucous membranes are moist.     Pharynx: Oropharynx is clear.  Eyes:     Extraocular Movements: Extraocular movements intact.     Conjunctiva/sclera: Conjunctivae normal.  Cardiovascular:     Rate and Rhythm: Normal rate and regular rhythm.     Heart sounds: No murmur heard.    No gallop.  Pulmonary:     Effort: Pulmonary effort is normal.     Breath sounds: No wheezing, rhonchi or rales.  Chest:  Breasts:    Right: No swelling, inverted nipple, mass or nipple  discharge.     Left: No swelling, inverted nipple, mass or nipple discharge.  Genitourinary:    Labia:        Right: No rash, tenderness or lesion.      Vagina: Vaginal discharge present. No erythema, tenderness, bleeding or lesions.     Cervix: No cervical motion tenderness, lesion, erythema or cervical bleeding.     Rectum: Normal.     Comments: Chaperone deferred  Musculoskeletal:     Cervical back: Normal range of motion and neck supple. No tenderness.     Right lower leg: No edema.     Left lower leg: No edema.  Lymphadenopathy:     Cervical: No cervical adenopathy.     Upper Body:     Right upper body: No supraclavicular or axillary adenopathy.     Left upper body: No supraclavicular or axillary adenopathy.   Skin:    General: Skin is warm and dry.  Neurological:     General: No focal deficit present.     Mental Status: She is alert and oriented to person, place, and time.  Psychiatric:        Mood and Affect: Mood normal.        Behavior: Behavior normal.     No results found for any visits on 03/15/24.     Assessment & Plan:    Routine Health Maintenance and Physical Exam  Health Maintenance  Topic Date Due   HIV Screening  Never done   Hepatitis C Screening  Never done   DTaP/Tdap/Td vaccine (1 - Tdap) Never done   Mammogram  08/21/2022   Zoster (Shingles) Vaccine (2 of 2) 02/06/2023   COVID-19 Vaccine (2 - 2024-25 season) 06/08/2023   Flu Shot  05/07/2024   Pap with HPV screening  08/04/2025   Colon Cancer Screening  11/03/2030   HPV Vaccine  Aged Out   Meningitis B Vaccine  Aged Out    Discussed health benefits of physical activity, and encouraged her to engage in regular exercise appropriate for her age and condition.  Problem List Items Addressed This Visit     Vitamin D  deficiency   Relevant Orders   Vitamin D , 25-hydroxy   Other Visit Diagnoses       Encounter to establish care    -  Primary     Annual visit for general adult medical examination without abnormal findings       Relevant Orders   CMP14+EGFR   CBC with Differential/Platelet     Screening for cervical cancer       Relevant Orders   IGP, Aptima HPV     Screening for human papillomavirus (HPV)       Relevant Orders   IGP, Aptima HPV     Encounter for screening mammogram for malignant neoplasm of breast       Relevant Orders   MM 3D SCREENING MAMMOGRAM BILATERAL BREAST     Screening for lipid disorders       Relevant Orders   Lipid panel      Return in about 1 year (around 03/15/2025) for or sooner as needed .  Adult wellness-complete.wellness physical was conducted today. Importance of diet and exercise were discussed in detail.  Importance of stress reduction and healthy living were  discussed.  In addition to this a discussion regarding safety was also covered.  We also reviewed over immunizations and gave recommendations regarding current immunization needed for age.   In addition to this additional areas  were also touched on including: following up with podiatrist for heel spurs, recommended regular dental care.  Preventative health exams needed: Shingle vaccination and mammogram (ordered today)  Colonoscopy due in 2032  Patient was advised yearly wellness exam     Jearlean Mince Dimonique Bourdeau, PA-C

## 2024-03-16 ENCOUNTER — Ambulatory Visit: Payer: Self-pay | Admitting: Physician Assistant

## 2024-03-16 LAB — CBC WITH DIFFERENTIAL/PLATELET
Basophils Absolute: 0 10*3/uL (ref 0.0–0.2)
Basos: 1 %
EOS (ABSOLUTE): 0.1 10*3/uL (ref 0.0–0.4)
Eos: 1 %
Hematocrit: 40.9 % (ref 34.0–46.6)
Hemoglobin: 12.8 g/dL (ref 11.1–15.9)
Immature Grans (Abs): 0 10*3/uL (ref 0.0–0.1)
Immature Granulocytes: 0 %
Lymphocytes Absolute: 1.9 10*3/uL (ref 0.7–3.1)
Lymphs: 37 %
MCH: 28.3 pg (ref 26.6–33.0)
MCHC: 31.3 g/dL — ABNORMAL LOW (ref 31.5–35.7)
MCV: 90 fL (ref 79–97)
Monocytes Absolute: 0.4 10*3/uL (ref 0.1–0.9)
Monocytes: 7 %
Neutrophils Absolute: 2.7 10*3/uL (ref 1.4–7.0)
Neutrophils: 54 %
Platelets: 245 10*3/uL (ref 150–450)
RBC: 4.53 x10E6/uL (ref 3.77–5.28)
RDW: 13.1 % (ref 11.7–15.4)
WBC: 5.1 10*3/uL (ref 3.4–10.8)

## 2024-03-16 LAB — LIPID PANEL
Chol/HDL Ratio: 3.2 ratio (ref 0.0–4.4)
Cholesterol, Total: 184 mg/dL (ref 100–199)
HDL: 57 mg/dL (ref >39)
LDL Chol Calc (NIH): 113 mg/dL — ABNORMAL HIGH (ref 0–99)
Triglycerides: 77 mg/dL (ref 0–149)
VLDL Cholesterol Cal: 14 mg/dL (ref 5–40)

## 2024-03-16 LAB — CMP14+EGFR
ALT: 15 IU/L (ref 0–32)
AST: 22 IU/L (ref 0–40)
Albumin: 4.3 g/dL (ref 3.9–4.9)
Alkaline Phosphatase: 86 IU/L (ref 44–121)
BUN/Creatinine Ratio: 11 — ABNORMAL LOW (ref 12–28)
BUN: 9 mg/dL (ref 8–27)
Bilirubin Total: 1.2 mg/dL (ref 0.0–1.2)
CO2: 23 mmol/L (ref 20–29)
Calcium: 8.9 mg/dL (ref 8.7–10.3)
Chloride: 100 mmol/L (ref 96–106)
Creatinine, Ser: 0.81 mg/dL (ref 0.57–1.00)
Globulin, Total: 2.9 g/dL (ref 1.5–4.5)
Glucose: 89 mg/dL (ref 70–99)
Potassium: 4.2 mmol/L (ref 3.5–5.2)
Sodium: 139 mmol/L (ref 134–144)
Total Protein: 7.2 g/dL (ref 6.0–8.5)
eGFR: 83 mL/min/{1.73_m2} (ref >59)

## 2024-03-16 LAB — VITAMIN D 25 HYDROXY (VIT D DEFICIENCY, FRACTURES): Vit D, 25-Hydroxy: 10.6 ng/mL — ABNORMAL LOW (ref 30.0–100.0)

## 2024-03-17 LAB — IGP, APTIMA HPV: HPV Aptima: NEGATIVE

## 2024-03-17 LAB — SPECIMEN STATUS REPORT

## 2024-05-10 DIAGNOSIS — L851 Acquired keratosis [keratoderma] palmaris et plantaris: Secondary | ICD-10-CM | POA: Diagnosis not present

## 2024-05-10 DIAGNOSIS — M7741 Metatarsalgia, right foot: Secondary | ICD-10-CM | POA: Diagnosis not present

## 2024-05-10 DIAGNOSIS — M79674 Pain in right toe(s): Secondary | ICD-10-CM | POA: Diagnosis not present
